# Patient Record
Sex: Female | Born: 1950 | Race: White | Hispanic: No | Marital: Married | Smoking: Current some day smoker
Health system: Southern US, Community
[De-identification: ages and names within clinical notes are randomized; demographics above are authoritative.]

## PROBLEM LIST (undated history)

## (undated) DIAGNOSIS — M1990 Unspecified osteoarthritis, unspecified site: Secondary | ICD-10-CM

## (undated) DIAGNOSIS — M940 Chondrocostal junction syndrome [Tietze]: Secondary | ICD-10-CM

## (undated) DIAGNOSIS — Z973 Presence of spectacles and contact lenses: Secondary | ICD-10-CM

## (undated) DIAGNOSIS — E785 Hyperlipidemia, unspecified: Secondary | ICD-10-CM

## (undated) DIAGNOSIS — I440 Atrioventricular block, first degree: Secondary | ICD-10-CM

## (undated) DIAGNOSIS — E0590 Thyrotoxicosis, unspecified without thyrotoxic crisis or storm: Secondary | ICD-10-CM

## (undated) DIAGNOSIS — K219 Gastro-esophageal reflux disease without esophagitis: Secondary | ICD-10-CM

## (undated) DIAGNOSIS — I10 Essential (primary) hypertension: Secondary | ICD-10-CM

## (undated) DIAGNOSIS — E0500 Thyrotoxicosis with diffuse goiter without thyrotoxic crisis or storm: Secondary | ICD-10-CM

## (undated) HISTORY — PX: TONSILLECTOMY AND ADENOIDECTOMY: SUR1326

---

## 2003-12-19 HISTORY — PX: SHOULDER ARTHROSCOPY WITH ROTATOR CUFF REPAIR: SHX5685

## 2010-11-25 ENCOUNTER — Ambulatory Visit
Admission: RE | Admit: 2010-11-25 | Discharge: 2010-11-25 | Payer: Self-pay | Source: Home / Self Care | Attending: Podiatry | Admitting: Podiatry

## 2010-11-25 HISTORY — PX: OTHER SURGICAL HISTORY: SHX169

## 2011-02-28 LAB — POCT I-STAT 4, (NA,K, GLUC, HGB,HCT)
Hemoglobin: 17 g/dL — ABNORMAL HIGH (ref 12.0–15.0)
Potassium: 4 mEq/L (ref 3.5–5.1)
Sodium: 142 mEq/L (ref 135–145)

## 2011-10-11 NOTE — Op Note (Signed)
NAME:  Lori Curtis, Lori Curtis            ACCOUNT NO.:  [id number]  MEDICAL RECORD NO.:  [id number]          PATIENT TYPE:  AMB  LOCATION:  [facility]                         FACILITY:  [facility]  PHYSICIAN:  Ezequiel Kayser. Saige Busby, D.P.M.DATE OF BIRTH:  Dec 23, 1950  DATE OF PROCEDURE: DATE OF DISCHARGE:  11/25/2010                              OPERATIVE REPORT   SURGEON:  Ezequiel Kayser. Harriet Pho, DPM  ASSISTANT:  None.  PREOPERATIVE DIAGNOSIS:  Hallux rigidus, left foot.  POSTOPERATIVE DIAGNOSIS:  Hallux rigidus, left foot.  PROCEDURE:  First MTP joint implant, left foot.  ANESTHESIA:  LMA.  COMPLICATIONS:  None.  DESCRIPTION OF PROCEDURE:  The patient was brought to the OR and placed in the supine position at which time, an LMA anesthetic was administered.  A local block was performed with a one-to-one mixture of 0.5% Marcaine plain and 0.5% Marcaine plain, and 1% lidocaine plain.  A well-padded pneumatic ankle tourniquet was applied superior to the medial malleolus.  The patient was prepped and draped in the usual aseptic manner.  The foot exsanguinated with an Esmarch bandage and the previously applied tourniquet inflated to 250 mmHg.  Attention was directed to the first ray where a dorsolinear incision was made.  The incision was deepened via sharp modalities, taking care to clamp and cauterize all bleeding vessels, and ensuring retraction of all neurovascular structures encountered.  The deep and superficial fascia was separated medially and laterally at the length of the incision.  The incision was continued such that a linear capsulotomy was performed. The capsule and periosteum were freed off the head of the first metatarsal and the base of the proximal phalanx.  There was a moderate amount of hypertrophic bone lipping, which was all resected and the medial eminence was resected, the first metatarsal head parallel with the shaft.  The first MTP joint was evaluated and approximately 80%  was eroded with one large essential erosion.  Attention was directed to the proximal aspect of the proximal phalanx where the bone was resected enough to allow for the implant to be placed in the proximal phalanx and to the decompression of the joint.  This was less than one third of the total shaft of the proximal phalanx.  This cut was made perpendicular to the weightbearing surface of the foot. This area was irrigated with copious amounts of sterile saline and antibiotic solution.  The central area of the proximal phalanx was drilled and a sizer placed to allow for appropriate implant incising. First MTP joint was evaluated with a close range of motion, and all hypertrophic bone again was resected and remodeled.  It was determined that a small medium-size cobalt-chromium nonporous implant was appropriate.  Using broach into the proximal phalanx, this allowed for placement of the implant.  Surgical wound had been irrigated with copious amounts of sterile saline and antibiotic solution before the implant was placed.  Range of motion was evaluated and found to be excellent.  Placement of the implant was confirmed with intraoperative radiographs.  Deep closure of the capsule and periosteum was reapproximated with 3-0 Vicryl, and deep closure was accomplished with 4- 0 Vicryl and skin closure  was accomplished with 4-0 Monocryl in running subcuticular fashion.  Postoperatively, additional 0.5% Marcaine plain was infiltrated around the surgical site.  The foot was dressed with Steri-Strips, 4 x 4's, Kling and Coban.  Tourniquet was deflated and vascular status returned to all digits.  The patient was sent to the recovery room with vital signs stable and capillary refill time at presurgical levels.  Written all postoperative instructions for the patient.  No guarantees given.          ______________________________ Ezequiel Kayser. Harriet Pho, D.P.M.     MJA/MEDQ  D:  12/13/2010  T:   12/13/2010  Job:  [id number]  Electronically Signed by Larey Dresser D.P.M. on 10/11/2011 05:56:10 PM

## 2012-07-02 ENCOUNTER — Encounter (HOSPITAL_COMMUNITY): Payer: Self-pay | Admitting: Pharmacy Technician

## 2012-07-11 ENCOUNTER — Ambulatory Visit (HOSPITAL_COMMUNITY)
Admission: RE | Admit: 2012-07-11 | Discharge: 2012-07-11 | Disposition: A | Discharge status: Home / Self Care | Payer: BC Managed Care – PPO | Source: Ambulatory Visit | Attending: Orthopaedic Surgery | Admitting: Orthopaedic Surgery

## 2012-07-11 ENCOUNTER — Other Ambulatory Visit: Payer: Self-pay | Admitting: Orthopaedic Surgery

## 2012-07-11 ENCOUNTER — Encounter (HOSPITAL_COMMUNITY)
Admission: RE | Admit: 2012-07-11 | Discharge: 2012-07-11 | Disposition: A | Discharge status: Home / Self Care | Payer: BC Managed Care – PPO | Source: Ambulatory Visit | Attending: Orthopaedic Surgery | Admitting: Orthopaedic Surgery

## 2012-07-11 ENCOUNTER — Encounter (HOSPITAL_COMMUNITY): Payer: Self-pay

## 2012-07-11 DIAGNOSIS — F17200 Nicotine dependence, unspecified, uncomplicated: Secondary | ICD-10-CM | POA: Insufficient documentation

## 2012-07-11 DIAGNOSIS — M5380 Other specified dorsopathies, site unspecified: Secondary | ICD-10-CM | POA: Insufficient documentation

## 2012-07-11 DIAGNOSIS — I10 Essential (primary) hypertension: Secondary | ICD-10-CM | POA: Insufficient documentation

## 2012-07-11 DIAGNOSIS — Z01812 Encounter for preprocedural laboratory examination: Secondary | ICD-10-CM | POA: Insufficient documentation

## 2012-07-11 DIAGNOSIS — Z01818 Encounter for other preprocedural examination: Secondary | ICD-10-CM | POA: Insufficient documentation

## 2012-07-11 DIAGNOSIS — I517 Cardiomegaly: Secondary | ICD-10-CM | POA: Insufficient documentation

## 2012-07-11 HISTORY — DX: Thyrotoxicosis, unspecified without thyrotoxic crisis or storm: E05.90

## 2012-07-11 HISTORY — DX: Essential (primary) hypertension: I10

## 2012-07-11 HISTORY — DX: Gastro-esophageal reflux disease without esophagitis: K21.9

## 2012-07-11 LAB — APTT: aPTT: 36 seconds (ref 24–37)

## 2012-07-11 LAB — PROTIME-INR: INR: 0.98 (ref 0.00–1.49)

## 2012-07-11 LAB — TYPE AND SCREEN
ABO/RH(D): O POS
Antibody Screen: NEGATIVE

## 2012-07-11 MED ORDER — VANCOMYCIN HCL 1000 MG IV SOLR: 2000.0000 mg | INTRAVENOUS | Status: DC

## 2012-07-11 MED ORDER — CHLORHEXIDINE GLUCONATE 4 % EX LIQD: 60.0000 mL | Freq: Once | CUTANEOUS | Status: DC

## 2012-07-11 NOTE — Pre-Procedure Instructions (Signed)
20 Lori Curtis  07/11/2012   Your procedure is scheduled on:  07/16/2012  TUESDAY  Report to [facility] at 0530 AM.  Call this number if you have problems the morning of surgery: [phone]   Remember:   Do not eat food:After Midnight.  May have  liquids:until Midnight .   Take these medicines the morning of surgery with A SIP OF WATER: AMLODIPINE  METOPROLOL   Do not wear jewelry, make-up or nail polish.  Do not wear lotions, powders, or perfumes. You may wear deodorant.  Do not shave 48 hours prior to surgery. Men may shave face and neck.  Do not bring valuables to the hospital.  Contacts, dentures or bridgework may not be worn into surgery.  Leave suitcase in the car. After surgery it may be brought to your room.  For patients admitted to the hospital, checkout time is 11:00 AM the day of discharge.   Patients discharged the day of surgery will not be allowed to drive home.  Name and phone number of your driver:   Special Instructions: CHG Shower Use Special Wash: 1/2 bottle night before surgery and 1/2 bottle morning of surgery.   Please read over the following fact sheets that you were given: Pain Booklet, Coughing and Deep Breathing, Blood Transfusion Information, Lab Information, Total Joint Packet, MRSA Information and Surgical Site Infection Prevention

## 2012-07-11 NOTE — Progress Notes (Addendum)
THERE ARE NO ORDERS FOR THIS PATIENT .LEFT MESSAGE ON Lori Curtis'S  ANSWERING  MACHINE REQUESTING ORDERS FOR 1000AM.   PRE ADMIT VISIT.

## 2012-07-12 NOTE — Consult Note (Addendum)
Anesthesia Chart Review:  Patient is a 61 year old female scheduled for left TKA on 07/16/12 by Dr. Jerl Santos.  History includes BMI 30, HTN, GERD, hyperthyroidism (Grave's disease), SOB, tonsillectomy.  Smoker per radiology report.  PCP is Dr. Daneil Dolin at [facility].  CXR on 07/11/12 showed: Diffuse interstitial prominence suggesting underlying bronchitic change in this patient with a history of smoking. Mild cardiac enlargement. No acute abnormality suggested.   Labs done at [facility] on 07/03/12 included a CMET, CBC, UA.  Cr 0.7,   PLT 94 (stable by notations on lab report).  H/H 15.3/44.8.   PT/PTT on 07/11/12 WNL.  PLT result called to Agustin Cree at Dr. Nolon Nations office.  EKG on 07/11/12 showed NSR, first degree AVB.  T wave inversion in aVL, nonspecific T wave abnormality in I.  Non-specific inferior ST abnormality.  (EKG read as SR with first degree AVB by interpreting Cardiologist.)  I reviewed her EKG findings with Anesthesiologist Dr. Jean Rosenthal.  In the absence of chest pain history and no known history of CAD then would anticipate she could proceed if no significant change in her status.  Records requested from Dr. Alben Spittle.  WIll follow-up when available.  Shonna Chock, PA-C 07/12/12 1215  Addendum: 07/12/12 1730 Notes reviewed from Dr. Wende Mott office.  There is no mention of thrombocytopenia history other then the notation on her labs sheet that read "stable over 4 years."  I reviewed her labs with Anesthesiologist Dr. Michelle Piper.  Will defer any additional orders, if any, to Dr. Jerl Santos.

## 2012-07-14 NOTE — H&P (Signed)
Lori Curtis is an 61 y.o. female.   Chief Complaint: Left knee pain HPI: She has had increasing left knee pain over the last year. Now pain with walking and at night. She has failed NSAID, cortisone and Supartz. 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 We have discussed moving on with a Left TKR to help restore painless function.  Past Medical History  Diagnosis Date  . Hypertension   . Hyperthyroidism     graves disease  . Shortness of breath   . GERD (gastroesophageal reflux disease)   . Arthritis     Past Surgical History  Procedure Date  . Tonsillectomy     No family history on file. Social History:  does not have a smoking history on file. She does not have any smokeless tobacco history on file. She reports that she does not drink alcohol or use illicit drugs.  Allergies:  Allergies  Allergen Reactions  . Amoxicillin     hives    No prescriptions prior to admission    No results found for this or any previous visit (from the past 48 hour(s)). No results found.  Review of Systems  Constitutional: Negative.   HENT: Negative.   Eyes: Negative.   Respiratory: Negative.   Cardiovascular: Negative.   Gastrointestinal: Negative.   Genitourinary: Negative.   Musculoskeletal: Negative.   Skin: Negative.   Neurological: Negative.   Endo/Heme/Allergies: Negative.   Psychiatric/Behavioral: Negative.     There were no vitals taken for this visit. Physical Exam  Constitutional: She appears well-nourished.  HENT:  Head: Atraumatic.  Eyes: Pupils are equal, round, and reactive to light.  Neck: Normal range of motion.  Cardiovascular: Regular rhythm.   Respiratory: Effort normal.  GI: Soft.  Musculoskeletal:       Left knee : pain with rom . Medial joint line pain. Rom 0 -120    !+ crepitation  Trace effusion  Neurological: She is alert.  Skin: Skin is warm.     Assessment/Plan  A: Left knee end stage DJD P: We have discussed going ahead with a L  TKR to relieve pain and improve function. We have discussed the risks associated with this procedure and the need for extensive rehab post op . She will be in the hospital between 2-3 days  Seth Higginbotham R 07/14/2012, 10:44 AM

## 2012-07-16 ENCOUNTER — Ambulatory Visit (HOSPITAL_COMMUNITY): Payer: BC Managed Care – PPO | Admitting: Vascular Surgery

## 2012-07-16 ENCOUNTER — Inpatient Hospital Stay (HOSPITAL_COMMUNITY)
Admission: RE | Admit: 2012-07-16 | Discharge: 2012-07-18 | DRG: 209 | Disposition: A | Discharge status: Home / Self Care | Payer: BC Managed Care – PPO | Source: Ambulatory Visit | Attending: Orthopaedic Surgery | Admitting: Orthopaedic Surgery

## 2012-07-16 ENCOUNTER — Encounter (HOSPITAL_COMMUNITY): Payer: Self-pay | Admitting: *Deleted

## 2012-07-16 ENCOUNTER — Encounter (HOSPITAL_COMMUNITY): Payer: Self-pay | Admitting: Vascular Surgery

## 2012-07-16 ENCOUNTER — Encounter (HOSPITAL_COMMUNITY)
Admission: RE | Disposition: A | Discharge status: Home / Self Care | Payer: Self-pay | Source: Ambulatory Visit | Attending: Orthopaedic Surgery

## 2012-07-16 DIAGNOSIS — M1712 Unilateral primary osteoarthritis, left knee: Secondary | ICD-10-CM | POA: Diagnosis present

## 2012-07-16 DIAGNOSIS — Z7982 Long term (current) use of aspirin: Secondary | ICD-10-CM

## 2012-07-16 DIAGNOSIS — Z79899 Other long term (current) drug therapy: Secondary | ICD-10-CM

## 2012-07-16 DIAGNOSIS — F17200 Nicotine dependence, unspecified, uncomplicated: Secondary | ICD-10-CM | POA: Diagnosis present

## 2012-07-16 DIAGNOSIS — I10 Essential (primary) hypertension: Secondary | ICD-10-CM | POA: Diagnosis present

## 2012-07-16 DIAGNOSIS — IMO0002 Reserved for concepts with insufficient information to code with codable children: Principal | ICD-10-CM | POA: Diagnosis present

## 2012-07-16 DIAGNOSIS — K219 Gastro-esophageal reflux disease without esophagitis: Secondary | ICD-10-CM | POA: Diagnosis present

## 2012-07-16 DIAGNOSIS — E0590 Thyrotoxicosis, unspecified without thyrotoxic crisis or storm: Secondary | ICD-10-CM | POA: Diagnosis present

## 2012-07-16 DIAGNOSIS — M1710 Unilateral primary osteoarthritis, unspecified knee: Principal | ICD-10-CM | POA: Diagnosis present

## 2012-07-16 HISTORY — PX: TOTAL KNEE ARTHROPLASTY: SHX125

## 2012-07-16 LAB — PROTIME-INR: Prothrombin Time: 13.1 seconds (ref 11.6–15.2)

## 2012-07-16 SURGERY — ARTHROPLASTY, KNEE, TOTAL
Anesthesia: General | Site: Knee | Laterality: Left | Wound class: Clean

## 2012-07-16 MED ORDER — DOCUSATE SODIUM 100 MG PO CAPS
100.0000 mg | ORAL_CAPSULE | Freq: Two times a day (BID) | ORAL | Status: DC
  Administered 2012-07-16 – 2012-07-18 (×5): 100 mg via ORAL
  Filled 2012-07-16 (×5): qty 1

## 2012-07-16 MED ORDER — ONDANSETRON HCL 4 MG/2ML IJ SOLN: 4.0000 mg | Freq: Four times a day (QID) | INTRAMUSCULAR | Status: DC | PRN

## 2012-07-16 MED ORDER — METHOCARBAMOL 500 MG PO TABS: 500.0000 mg | ORAL_TABLET | Freq: Four times a day (QID) | ORAL | Status: DC | PRN

## 2012-07-16 MED ORDER — HYDROMORPHONE HCL PF 1 MG/ML IJ SOLN
0.5000 mg | INTRAMUSCULAR | Status: DC | PRN
  Administered 2012-07-16 – 2012-07-17 (×7): 1 mg via INTRAVENOUS
  Filled 2012-07-16 (×7): qty 1

## 2012-07-16 MED ORDER — TOBRAMYCIN SULFATE 1.2 G IJ SOLR
INTRAMUSCULAR | Status: DC | PRN
  Administered 2012-07-16: 1.2 g

## 2012-07-16 MED ORDER — ACETAMINOPHEN 325 MG PO TABS: 650.0000 mg | ORAL_TABLET | Freq: Four times a day (QID) | ORAL | Status: DC | PRN

## 2012-07-16 MED ORDER — SIMVASTATIN 40 MG PO TABS
40.0000 mg | ORAL_TABLET | Freq: Every evening | ORAL | Status: DC
  Administered 2012-07-16 – 2012-07-17 (×2): 40 mg via ORAL
  Filled 2012-07-16 (×3): qty 1

## 2012-07-16 MED ORDER — METOPROLOL SUCCINATE ER 100 MG PO TB24
100.0000 mg | ORAL_TABLET | Freq: Every day | ORAL | Status: DC
  Administered 2012-07-17 – 2012-07-18 (×2): 100 mg via ORAL
  Filled 2012-07-16 (×2): qty 1

## 2012-07-16 MED ORDER — METOCLOPRAMIDE HCL 5 MG/ML IJ SOLN
5.0000 mg | Freq: Three times a day (TID) | INTRAMUSCULAR | Status: DC | PRN
  Filled 2012-07-16: qty 2

## 2012-07-16 MED ORDER — BUPIVACAINE-EPINEPHRINE PF 0.5-1:200000 % IJ SOLN
INTRAMUSCULAR | Status: DC | PRN
  Administered 2012-07-16: 30 mL

## 2012-07-16 MED ORDER — METOCLOPRAMIDE HCL 10 MG PO TABS: 5.0000 mg | ORAL_TABLET | Freq: Three times a day (TID) | ORAL | Status: DC | PRN

## 2012-07-16 MED ORDER — ASPIRIN EC 325 MG PO TBEC
325.0000 mg | DELAYED_RELEASE_TABLET | Freq: Two times a day (BID) | ORAL | Status: DC
  Administered 2012-07-16 – 2012-07-18 (×4): 325 mg via ORAL
  Filled 2012-07-16 (×6): qty 1

## 2012-07-16 MED ORDER — METHOCARBAMOL 100 MG/ML IJ SOLN
500.0000 mg | Freq: Four times a day (QID) | INTRAVENOUS | Status: DC | PRN
  Administered 2012-07-16: 500 mg via INTRAVENOUS
  Filled 2012-07-16: qty 5

## 2012-07-16 MED ORDER — DEXTROSE 5 % IV SOLN
INTRAVENOUS | Status: DC | PRN
  Administered 2012-07-16: 08:00:00 via INTRAVENOUS

## 2012-07-16 MED ORDER — CELECOXIB 200 MG PO CAPS
200.0000 mg | ORAL_CAPSULE | Freq: Every day | ORAL | Status: DC
  Administered 2012-07-17 – 2012-07-18 (×2): 200 mg via ORAL
  Filled 2012-07-16 (×2): qty 1

## 2012-07-16 MED ORDER — ACETAMINOPHEN 650 MG RE SUPP: 650.0000 mg | Freq: Four times a day (QID) | RECTAL | Status: DC | PRN

## 2012-07-16 MED ORDER — LACTATED RINGERS IV SOLN: INTRAVENOUS | Status: DC

## 2012-07-16 MED ORDER — METHIMAZOLE 5 MG PO TABS
5.0000 mg | ORAL_TABLET | Freq: Every day | ORAL | Status: DC
  Administered 2012-07-16 – 2012-07-17 (×2): 5 mg via ORAL
  Filled 2012-07-16 (×3): qty 1

## 2012-07-16 MED ORDER — METHIMAZOLE 5 MG PO TABS
2.5000 mg | ORAL_TABLET | Freq: Every day | ORAL | Status: DC
  Administered 2012-07-16 – 2012-07-17 (×2): 2.5 mg via ORAL
  Filled 2012-07-16 (×3): qty 1

## 2012-07-16 MED ORDER — EPHEDRINE SULFATE 50 MG/ML IJ SOLN
INTRAMUSCULAR | Status: DC | PRN
  Administered 2012-07-16 (×5): 5 mg via INTRAVENOUS
  Administered 2012-07-16: 10 mg via INTRAVENOUS

## 2012-07-16 MED ORDER — CHLORHEXIDINE GLUCONATE 4 % EX LIQD: 60.0000 mL | Freq: Once | CUTANEOUS | Status: DC

## 2012-07-16 MED ORDER — ALUM & MAG HYDROXIDE-SIMETH 200-200-20 MG/5ML PO SUSP: 30.0000 mL | ORAL | Status: DC | PRN

## 2012-07-16 MED ORDER — FLEET ENEMA 7-19 GM/118ML RE ENEM: 1.0000 | ENEMA | Freq: Once | RECTAL | Status: AC | PRN

## 2012-07-16 MED ORDER — OXYCODONE-ACETAMINOPHEN 5-325 MG PO TABS
1.0000 | ORAL_TABLET | ORAL | Status: DC | PRN
  Administered 2012-07-16 – 2012-07-18 (×9): 2 via ORAL
  Filled 2012-07-16 (×10): qty 2

## 2012-07-16 MED ORDER — VANCOMYCIN HCL IN DEXTROSE 1-5 GM/200ML-% IV SOLN
1000.0000 mg | INTRAVENOUS | Status: AC
  Administered 2012-07-16: 1000 mg via INTRAVENOUS

## 2012-07-16 MED ORDER — MENTHOL 3 MG MT LOZG: 1.0000 | LOZENGE | OROMUCOSAL | Status: DC | PRN

## 2012-07-16 MED ORDER — LACTATED RINGERS IV SOLN
INTRAVENOUS | Status: DC | PRN
  Administered 2012-07-16 (×2): via INTRAVENOUS

## 2012-07-16 MED ORDER — METOCLOPRAMIDE HCL 5 MG/ML IJ SOLN: 5.0000 mg | Freq: Three times a day (TID) | INTRAMUSCULAR | Status: DC | PRN

## 2012-07-16 MED ORDER — HYDROMORPHONE HCL PF 1 MG/ML IJ SOLN
0.2500 mg | INTRAMUSCULAR | Status: DC | PRN
  Administered 2012-07-16 (×2): 0.5 mg via INTRAVENOUS

## 2012-07-16 MED ORDER — METOCLOPRAMIDE HCL 5 MG PO TABS
5.0000 mg | ORAL_TABLET | Freq: Three times a day (TID) | ORAL | Status: DC | PRN
  Filled 2012-07-16: qty 2

## 2012-07-16 MED ORDER — ONDANSETRON HCL 4 MG PO TABS: 4.0000 mg | ORAL_TABLET | Freq: Four times a day (QID) | ORAL | Status: DC | PRN

## 2012-07-16 MED ORDER — TOBRAMYCIN SULFATE 1.2 G IJ SOLR
INTRAMUSCULAR | Status: AC
  Filled 2012-07-16: qty 1.2

## 2012-07-16 MED ORDER — DIPHENHYDRAMINE HCL 12.5 MG/5ML PO ELIX
12.5000 mg | ORAL_SOLUTION | ORAL | Status: DC | PRN
  Administered 2012-07-17 – 2012-07-18 (×4): 25 mg via ORAL
  Filled 2012-07-16 (×2): qty 5
  Filled 2012-07-16: qty 10

## 2012-07-16 MED ORDER — MIDAZOLAM HCL 5 MG/5ML IJ SOLN
INTRAMUSCULAR | Status: DC | PRN
  Administered 2012-07-16: 2 mg via INTRAVENOUS

## 2012-07-16 MED ORDER — ACETAMINOPHEN 10 MG/ML IV SOLN
INTRAVENOUS | Status: AC
  Filled 2012-07-16: qty 100

## 2012-07-16 MED ORDER — HYDROMORPHONE HCL PF 1 MG/ML IJ SOLN
INTRAMUSCULAR | Status: AC
  Filled 2012-07-16: qty 1

## 2012-07-16 MED ORDER — METHOCARBAMOL 500 MG PO TABS
500.0000 mg | ORAL_TABLET | Freq: Four times a day (QID) | ORAL | Status: DC | PRN
  Administered 2012-07-16 – 2012-07-17 (×3): 500 mg via ORAL
  Filled 2012-07-16 (×4): qty 1

## 2012-07-16 MED ORDER — METHOCARBAMOL 100 MG/ML IJ SOLN: 500.0000 mg | Freq: Four times a day (QID) | INTRAVENOUS | Status: DC | PRN

## 2012-07-16 MED ORDER — ONDANSETRON HCL 4 MG/2ML IJ SOLN
INTRAMUSCULAR | Status: DC | PRN
  Administered 2012-07-16: 4 mg via INTRAVENOUS

## 2012-07-16 MED ORDER — ZOLPIDEM TARTRATE 5 MG PO TABS
5.0000 mg | ORAL_TABLET | Freq: Every evening | ORAL | Status: DC | PRN
  Filled 2012-07-16 (×2): qty 1

## 2012-07-16 MED ORDER — LIDOCAINE HCL (CARDIAC) 20 MG/ML IV SOLN
INTRAVENOUS | Status: DC | PRN
  Administered 2012-07-16: 90 mg via INTRAVENOUS

## 2012-07-16 MED ORDER — METHIMAZOLE 5 MG PO TABS: 2.5000 mg | ORAL_TABLET | Freq: Two times a day (BID) | ORAL | Status: DC

## 2012-07-16 MED ORDER — LACTATED RINGERS IV SOLN
INTRAVENOUS | Status: DC
  Administered 2012-07-16: 10:00:00 via INTRAVENOUS

## 2012-07-16 MED ORDER — ONDANSETRON HCL 4 MG/2ML IJ SOLN: 4.0000 mg | Freq: Once | INTRAMUSCULAR | Status: DC | PRN

## 2012-07-16 MED ORDER — BISACODYL 5 MG PO TBEC: 5.0000 mg | DELAYED_RELEASE_TABLET | Freq: Every day | ORAL | Status: DC | PRN

## 2012-07-16 MED ORDER — AMLODIPINE BESYLATE 10 MG PO TABS
10.0000 mg | ORAL_TABLET | Freq: Every day | ORAL | Status: DC
  Administered 2012-07-17 – 2012-07-18 (×2): 10 mg via ORAL
  Filled 2012-07-16 (×2): qty 1

## 2012-07-16 MED ORDER — PHENOL 1.4 % MT LIQD: 1.0000 | OROMUCOSAL | Status: DC | PRN

## 2012-07-16 MED ORDER — VANCOMYCIN HCL IN DEXTROSE 1-5 GM/200ML-% IV SOLN
1000.0000 mg | Freq: Two times a day (BID) | INTRAVENOUS | Status: AC
  Administered 2012-07-16: 1000 mg via INTRAVENOUS
  Filled 2012-07-16: qty 200

## 2012-07-16 MED ORDER — PROPOFOL 10 MG/ML IV EMUL
INTRAVENOUS | Status: DC | PRN
  Administered 2012-07-16: 150 mg via INTRAVENOUS
  Administered 2012-07-16: 10 mg via INTRAVENOUS
  Administered 2012-07-16: 20 mg via INTRAVENOUS

## 2012-07-16 MED ORDER — FENTANYL CITRATE 0.05 MG/ML IJ SOLN
INTRAMUSCULAR | Status: DC | PRN
  Administered 2012-07-16 (×2): 50 ug via INTRAVENOUS
  Administered 2012-07-16: 100 ug via INTRAVENOUS
  Administered 2012-07-16: 50 ug via INTRAVENOUS

## 2012-07-16 MED ORDER — SODIUM CHLORIDE 0.9 % IR SOLN
Status: DC | PRN
  Administered 2012-07-16: 1000 mL

## 2012-07-16 SURGICAL SUPPLY — 64 items
AUTOTRANSFUSION W/QD PVC DRAIN (AUTOTRANSFUSION) ×2 IMPLANT
BANDAGE ELASTIC 4 VELCRO ST LF (GAUZE/BANDAGES/DRESSINGS) ×2 IMPLANT
BANDAGE ELASTIC 6 VELCRO ST LF (GAUZE/BANDAGES/DRESSINGS) ×2 IMPLANT
BANDAGE ESMARK 6X9 LF (GAUZE/BANDAGES/DRESSINGS) ×1 IMPLANT
BANDAGE GAUZE ELAST BULKY 4 IN (GAUZE/BANDAGES/DRESSINGS) ×2 IMPLANT
BENZOIN TINCTURE PRP APPL 2/3 (GAUZE/BANDAGES/DRESSINGS) ×2 IMPLANT
BLADE SAGITTAL 25.0X1.19X90 (BLADE) ×2 IMPLANT
BLADE SURG ROTATE 9660 (MISCELLANEOUS) IMPLANT
BNDG ELASTIC 6X10 VLCR STRL LF (GAUZE/BANDAGES/DRESSINGS) ×2 IMPLANT
BNDG ESMARK 6X9 LF (GAUZE/BANDAGES/DRESSINGS) ×2
BOWL SMART MIX CTS (DISPOSABLE) ×2 IMPLANT
CEMENT HV SMART SET (Cement) ×4 IMPLANT
CLOTH BEACON ORANGE TIMEOUT ST (SAFETY) ×2 IMPLANT
CLSR STERI-STRIP ANTIMIC 1/2X4 (GAUZE/BANDAGES/DRESSINGS) ×2 IMPLANT
COVER SURGICAL LIGHT HANDLE (MISCELLANEOUS) ×2 IMPLANT
CUFF TOURNIQUET SINGLE 34IN LL (TOURNIQUET CUFF) ×2 IMPLANT
CUFF TOURNIQUET SINGLE 44IN (TOURNIQUET CUFF) IMPLANT
DRAPE EXTREMITY T 121X128X90 (DRAPE) ×2 IMPLANT
DRAPE PROXIMA HALF (DRAPES) ×2 IMPLANT
DRAPE U-SHAPE 47X51 STRL (DRAPES) ×2 IMPLANT
DRSG ADAPTIC 3X8 NADH LF (GAUZE/BANDAGES/DRESSINGS) ×2 IMPLANT
DRSG PAD ABDOMINAL 8X10 ST (GAUZE/BANDAGES/DRESSINGS) ×2 IMPLANT
DURAPREP 26ML APPLICATOR (WOUND CARE) ×2 IMPLANT
ELECT REM PT RETURN 9FT ADLT (ELECTROSURGICAL) ×2
ELECTRODE REM PT RTRN 9FT ADLT (ELECTROSURGICAL) ×1 IMPLANT
EVACUATOR 1/8 PVC DRAIN (DRAIN) ×2 IMPLANT
FACESHIELD LNG OPTICON STERILE (SAFETY) ×4 IMPLANT
GLOVE BIO SURGEON STRL SZ8.5 (GLOVE) ×2 IMPLANT
GLOVE BIOGEL PI IND STRL 8 (GLOVE) ×1 IMPLANT
GLOVE BIOGEL PI IND STRL 8.5 (GLOVE) ×1 IMPLANT
GLOVE BIOGEL PI INDICATOR 8 (GLOVE) ×1
GLOVE BIOGEL PI INDICATOR 8.5 (GLOVE) ×1
GLOVE SS BIOGEL STRL SZ 8 (GLOVE) ×1 IMPLANT
GLOVE SUPERSENSE BIOGEL SZ 8 (GLOVE) ×1
GOWN PREVENTION PLUS XLARGE (GOWN DISPOSABLE) ×2 IMPLANT
GOWN PREVENTION PLUS XXLARGE (GOWN DISPOSABLE) ×2 IMPLANT
GOWN STRL NON-REIN LRG LVL3 (GOWN DISPOSABLE) ×2 IMPLANT
HANDPIECE INTERPULSE COAX TIP (DISPOSABLE) ×1
HOOD PEEL AWAY FACE SHEILD DIS (HOOD) ×2 IMPLANT
IMMOBILIZER KNEE 20 (SOFTGOODS)
IMMOBILIZER KNEE 20 THIGH 36 (SOFTGOODS) IMPLANT
IMMOBILIZER KNEE 22 UNIV (SOFTGOODS) ×2 IMPLANT
IMMOBILIZER KNEE 24 THIGH 36 (MISCELLANEOUS) IMPLANT
IMMOBILIZER KNEE 24 UNIV (MISCELLANEOUS)
KIT BASIN OR (CUSTOM PROCEDURE TRAY) ×2 IMPLANT
KIT ROOM TURNOVER OR (KITS) ×2 IMPLANT
MANIFOLD NEPTUNE II (INSTRUMENTS) ×2 IMPLANT
NS IRRIG 1000ML POUR BTL (IV SOLUTION) ×2 IMPLANT
PACK TOTAL JOINT (CUSTOM PROCEDURE TRAY) ×2 IMPLANT
PAD ARMBOARD 7.5X6 YLW CONV (MISCELLANEOUS) ×4 IMPLANT
SET HNDPC FAN SPRY TIP SCT (DISPOSABLE) ×1 IMPLANT
SPONGE GAUZE 4X4 12PLY (GAUZE/BANDAGES/DRESSINGS) ×2 IMPLANT
STAPLER VISISTAT 35W (STAPLE) ×2 IMPLANT
SUCTION FRAZIER TIP 10 FR DISP (SUCTIONS) ×2 IMPLANT
SUT MNCRL AB 4-0 PS2 18 (SUTURE) ×2 IMPLANT
SUT VIC AB 0 CT1 27 (SUTURE) ×2
SUT VIC AB 0 CT1 27XBRD ANBCTR (SUTURE) ×2 IMPLANT
SUT VIC AB 2-0 CT1 27 (SUTURE) ×2
SUT VIC AB 2-0 CT1 TAPERPNT 27 (SUTURE) ×2 IMPLANT
SUT VLOC 180 0 24IN GS25 (SUTURE) ×2 IMPLANT
TOWEL OR 17X24 6PK STRL BLUE (TOWEL DISPOSABLE) ×2 IMPLANT
TOWEL OR 17X26 10 PK STRL BLUE (TOWEL DISPOSABLE) ×2 IMPLANT
TRAY FOLEY CATH 14FR (SET/KITS/TRAYS/PACK) ×2 IMPLANT
WATER STERILE IRR 1000ML POUR (IV SOLUTION) ×6 IMPLANT

## 2012-07-16 NOTE — Preoperative (Signed)
Beta Blockers   Reason not to administer Beta Blockers:toprol 0445 today

## 2012-07-16 NOTE — Plan of Care (Signed)
Problem: Consults Goal: Diagnosis- Total Joint Replacement Primary Total Knee Left     

## 2012-07-16 NOTE — Op Note (Addendum)
PREOP DIAGNOSIS: DJD LEFT KNEE POSTOP DIAGNOSIS: DJD LEFT KNEE PROCEDURE: LEFT TKR ANESTHESIA: General and block ATTENDING SURGEON: Deaunte Dente G ASSISTANT: Lindwood Qua PA  INDICATIONS FOR PROCEDURE: Lori Curtis is a 61 y.o. female who has struggled for a long time with pain due to degenerative arthritis of the left knee.  The patient has failed many conservative non-operative measures and at this point has pain which limits the ability to sleep and walk.  The patient is offered total knee replacement.  Informed operative consent was obtained after discussion of possible risks of anesthesia, infection, neurovascular injury, DVT, and death.  The importance of the post-operative rehabilitation protocol to optimize result was stressed extensively with the patient.  SUMMARY OF FINDINGS AND PROCEDURE:  Lori Curtis was taken to the operative suite where under the above anesthesia a left knee replacement was performed.  There were advanced degenerative changes and the bone quality was excellent.  We used the DePuy system and placed size standard femur, 2.5 tibia, 35mm all polyethylene patella, and a size 10mm spacer.  I did include tobromycin antibiotic in the cement.  The patient was admitted for appropriate post-op care to include perioperative antibiotics and mechanical and pharmacologic measures for DVT prophylaxis.  DESCRIPTION OF PROCEDURE:  Lori Curtis was taken to the operative suite where the above anesthesia was applied.  The patient was positioned supine and prepped and draped in normal sterile fashion.  An appropriate time out was performed.  After the administration of vancomycin pre-op antibiotic a standard longitudinal incision was made on the anterior knee.  Dissection was carried down to the extensor mechanism.  All appropriate anti-infective measures were used including the pre-operative antibiotic, betadine impregnated drape, and closed hooded exhaust systems for each  member of the surgical team.  A medial parapatellar incision was made in the extensor mechanism and the knee cap flipped and the knee flexed.  Some residual meniscal tissues were removed along with any remaining ACL/PCL tissue.  A guide was placed on the tibia and a flat cut was made on it's superior surface.  An intramedullary guide was placed in the femur and was utilized to make anterior and posterior cuts creating an appropriate flexion gap.  A second intramedullary guide was placed in the femur to make a distal cut properly balancing the knee with an extension gap equal to the flexion gap.  The three bones sized to the above mentioned sizes and the appropriate guides were placed and utilized.  A trial reduction was done and the knee easily came to full extension and the patella tracked well on flexion.  The trial components were removed and all bones were cleaned with pulsatile lavage and then dried thoroughly.  Cement was mixed including antibiotic and was pressurized onto the bones followed by placement of the aforementioned components.  Excess cement was trimmed and pressure was held on the components until the cement had hardened.  The tourniquet was deflated and a small amount of bleeding was controlled with cautery and pressure.  The knee was irrigated.throoughly. The extensor mechanism was re-approximated with V-loc suture in running fashion.  The knee was flexed and the repair was solid.  The subcutaneous tissues were re-approximated with #0 and #2-0 vicryl and the skin closed with staples.  A sterile dressing was applied.  Intraoperative fluids, EBL, and tourniquet time can be obtained from anesthesia records.  DISPOSITION:  The patient was taken to recovery room in stable condition and admitted for appropriate post-op care to include peri-operative  antibiotic and DVT prophylaxis with mechanical and pharmacologic measures.  Athanasius Kesling G 07/16/2012, 9:53 AM

## 2012-07-16 NOTE — Anesthesia Procedure Notes (Signed)
Anesthesia Regional Block:  Femoral nerve block  Pre-Anesthetic Checklist: ,, timeout performed, Correct Patient, Correct Site, Correct Laterality, Correct Procedure, Correct Position, site marked, Risks and benefits discussed,  Surgical consent,  Pre-op evaluation,  At surgeon's request and post-op pain management  Laterality: Left and Lower  Prep: chloraprep       Needles:  Injection technique: Single-shot  Needle Type: Echogenic Needle     Needle Length: 9cm  Needle Gauge: 22 and 22 G    Additional Needles:  Procedures: ultrasound guided Femoral nerve block Narrative:  Start time: 07/16/2012 7:28 AM End time: 07/16/2012 7:49 AM Injection made incrementally with aspirations every 5 mL.  Performed by: Personally  Anesthesiologist: Sheldon Silvan, MD  Additional Notes: Marcaine 0.5% with EPI 1:200000, 30 ml.  [place] picture unavailable as printer malfunctioned on [place] machine.  Femoral nerve block

## 2012-07-16 NOTE — Interval H&P Note (Signed)
History and Physical Interval Note:  07/16/2012 7:25 AM  Lori Curtis  has presented today for surgery, with the diagnosis of LEFT KNEE DEGENERATIVE JOINT DISEASE  The various methods of treatment have been discussed with the patient and family. After consideration of risks, benefits and other options for treatment, the patient has consented to  Procedure(s) (LRB): TOTAL KNEE ARTHROPLASTY (Left) as a surgical intervention .  The patient's history has been reviewed, patient examined, no change in status, stable for surgery.  I have reviewed the patient's chart and labs.  Questions were answered to the patient's satisfaction.     Xander Jutras G

## 2012-07-16 NOTE — Anesthesia Postprocedure Evaluation (Signed)
  Anesthesia Post-op Note  Patient: Lori Curtis  Procedure(s) Performed: Procedure(s) (LRB): TOTAL KNEE ARTHROPLASTY (Left)  Patient Location: PACU  Anesthesia Type: General  Level of Consciousness: awake, alert  and oriented  Airway and Oxygen Therapy: Patient Spontanous Breathing and Patient connected to nasal cannula oxygen  Post-op Pain: mild  Post-op Assessment: Post-op Vital signs reviewed  Post-op Vital Signs: Reviewed  Complications: No apparent anesthesia complications

## 2012-07-16 NOTE — Progress Notes (Signed)
Orthopedic Tech Progress Note Patient Details:  Lori Curtis [birth year] [id number]  CPM Left Knee CPM Left Knee: On Left Knee Flexion (Degrees): 60  Left Knee Extension (Degrees): 0    Lori Curtis T 07/16/2012, 10:58 AM

## 2012-07-16 NOTE — Transfer of Care (Signed)
Immediate Anesthesia Transfer of Care Note  Patient: Lori Curtis  Procedure(s) Performed: Procedure(s) (LRB): TOTAL KNEE ARTHROPLASTY (Left)  Patient Location: PACU  Anesthesia Type: General  Level of Consciousness: awake and pateint uncooperative  Airway & Oxygen Therapy: Patient Spontanous Breathing and Patient connected to nasal cannula oxygen  Post-op Assessment: Report given to PACU RN, Post -op Vital signs reviewed and stable, Patient moving all extremities and writhing in pain/disorientation...  Post vital signs: Reviewed and stable  Complications: No apparent anesthesia complications

## 2012-07-16 NOTE — Anesthesia Preprocedure Evaluation (Addendum)
Anesthesia Evaluation  Patient identified by MRN, date of birth, ID band Patient awake    Reviewed: Allergy & Precautions, H&P , NPO status , Patient's Chart, lab work & pertinent test results, reviewed documented beta blocker date and time   Airway Mallampati: I TM Distance: >3 FB Neck ROM: Full    Dental  (+) Teeth Intact, Dental Advisory Given and Partial Upper   Pulmonary  breath sounds clear to auscultation        Cardiovascular hypertension, Pt. on medications and Pt. on home beta blockers Rhythm:Regular Rate:Normal     Neuro/Psych    GI/Hepatic GERD-  Medicated and Controlled,  Endo/Other  Hyperthyroidism   Renal/GU      Musculoskeletal   Abdominal   Peds  Hematology   Anesthesia Other Findings   Reproductive/Obstetrics                         Anesthesia Physical Anesthesia Plan  ASA: III  Anesthesia Plan: General   Post-op Pain Management:    Induction: Intravenous  Airway Management Planned: LMA  Additional Equipment:   Intra-op Plan:   Post-operative Plan: Extubation in OR  Informed Consent: I have reviewed the patients History and Physical, chart, labs and discussed the procedure including the risks, benefits and alternatives for the proposed anesthesia with the patient or authorized representative who has indicated his/her understanding and acceptance.   Dental advisory given  Plan Discussed with: CRNA, Anesthesiologist and Surgeon  Anesthesia Plan Comments:         Anesthesia Quick Evaluation

## 2012-07-17 LAB — BASIC METABOLIC PANEL
GFR calc Af Amer: >90 mL/min (ref >90)
GFR calc non Af Amer: >90 mL/min (ref >90)
Potassium: 4.6 mEq/L (ref 3.5–5.1)
Sodium: 134 mEq/L — ABNORMAL LOW (ref 135–145)

## 2012-07-17 LAB — CBC
MCHC: 33.2 g/dL (ref 30.0–36.0)
Platelets: 91 10*3/uL — ABNORMAL LOW (ref 150–400)
RDW: 14.3 % (ref 11.5–15.5)

## 2012-07-17 NOTE — Progress Notes (Signed)
BMET pending at this time

## 2012-07-17 NOTE — Progress Notes (Signed)
Physical Therapy Treatment Patient Details Name: Lori Curtis MRN: [id number] DOB: November 17, 1951 Today's Date: 07/17/2012 Time: 1333-1400 PT Time Calculation (min): 27 min  PT Assessment / Plan / Recommendation Comments on Treatment Session  Pt able to increase ambulation distance this PM and complete HEP.  Pt left on CPM on 0 to 60 degrees.  Will continue to follow.    Follow Up Recommendations  Home health PT    Barriers to Discharge        Equipment Recommendations  None recommended by PT    Recommendations for Other Services    Frequency 7X/week   Plan Discharge plan remains appropriate;Frequency remains appropriate    Precautions / Restrictions Precautions Precautions: Knee Precaution Booklet Issued: No Restrictions Weight Bearing Restrictions: Yes LLE Weight Bearing: Weight bearing as tolerated   Pertinent Vitals/Pain 6/10 left LE pain    Mobility  Bed Mobility Bed Mobility: Supine to Sit Supine to Sit: 4: Min assist;HOB elevated Sit to Supine: 4: Min assist Details for Bed Mobility Assistance: (A) with left LE OOB and into bed  Transfers Transfers: Sit to Stand;Stand to Sit Sit to Stand: 4: Min assist;With upper extremity assist;From bed Stand to Sit: 4: Min assist;With upper extremity assist;To chair/3-in-1 Details for Transfer Assistance: (A) to initaite transfer and cues for hand placement Ambulation/Gait Ambulation/Gait Assistance: 4: Min guard Ambulation Distance (Feet): 100 Feet Assistive device: Rolling walker Ambulation/Gait Assistance Details: Minguard for safety with cues for RW placement and proper step sequence. Gait Pattern: Step-to pattern;Decreased step length - left;Decreased stance time - left;Trunk flexed Stairs: No    Exercises Total Joint Exercises Ankle Circles/Pumps: AROM;Left;10 reps;Supine Quad Sets: AROM;Left;10 reps;Supine Short Arc Quad: Strengthening;Left;5 reps Heel Slides: AAROM;Left;10 reps;Supine Hip ABduction/ADduction:  Strengthening;Left;5 reps Straight Leg Raises: Strengthening;Left;5 reps   PT Diagnosis:    PT Problem List:   PT Treatment Interventions:     PT Goals Acute Rehab PT Goals PT Goal Formulation: With patient Time For Goal Achievement: 07/24/12 Potential to Achieve Goals: Good Pt will go Supine/Side to Sit: with modified independence PT Goal: Supine/Side to Sit - Progress: Progressing toward goal Pt will go Sit to Supine/Side: with modified independence PT Goal: Sit to Supine/Side - Progress: Progressing toward goal Pt will go Sit to Stand: with modified independence PT Goal: Sit to Stand - Progress: Progressing toward goal Pt will go Stand to Sit: with modified independence PT Goal: Stand to Sit - Progress: Progressing toward goal Pt will Ambulate: >150 feet;with modified independence;with least restrictive assistive device PT Goal: Ambulate - Progress: Progressing toward goal Pt will Perform Home Exercise Program: Independently PT Goal: Perform Home Exercise Program - Progress: Progressing toward goal  Visit Information  Last PT Received On: 07/17/12 Assistance Needed: +1    Subjective Data  Subjective: "I'm much better this afternoon." Patient Stated Goal: Go to girlfriend's house.   Cognition  Overall Cognitive Status: Appears within functional limits for tasks assessed/performed Arousal/Alertness: Awake/alert Orientation Level: Appears intact for tasks assessed Behavior During Session: [facility] for tasks performed    Balance     End of Session PT - End of Session Equipment Utilized During Treatment: Gait belt Activity Tolerance: Patient tolerated treatment well Patient left: in bed;with call bell/phone within reach;with family/visitor present Nurse Communication: Mobility status   GP     Lori Curtis 07/17/2012, 2:47 PM Lori Curtis, PT DPT [phone]

## 2012-07-17 NOTE — Progress Notes (Signed)
Subjective: 1 Day Post-Op Procedure(s) (LRB): TOTAL KNEE ARTHROPLASTY (Left)  Activity level:  Bed rest , oob today with pt  wbat Diet tolerance:  eating Voiding:  Foley out  Patient reports pain as 6 on 0-10 scale.    Objective: Vital signs in last 24 hours: Temp:  [97 F (36.1 C)-99.7 F (37.6 C)] 99.7 F (37.6 C) (07/31 0555) Pulse Rate:  [58-67] 67  (07/31 0555) Resp:  [12-20] 20  (07/31 0555) BP: (110-122)/(43-58) 122/53 mmHg (07/31 0555) SpO2:  [97 %-100 %] 98 % (07/31 0555)  Labs:  Basename 07/17/12 0706  HGB 13.0    Basename 07/17/12 0706  WBC 9.0  RBC 4.31  HCT 39.1  PLT PENDING   No results found for this basename: NA:2,K:2,CL:2,CO2:2,BUN:2,CREATININE:2,GLUCOSE:2,CALCIUM:2 in the last 72 hours  Basename 07/16/12 0643  LABPT --  INR 0.97    Physical Exam:  Neurologically intact ABD soft Neurovascular intact Sensation intact distally Intact pulses distally Dorsiflexion/Plantar flexion intact No cellulitis present Compartment soft Ace wrap removed , dressing is dry  Assessment/Plan:  1 Day Post-Op Procedure(s) (LRB): TOTAL KNEE ARTHROPLASTY (Left) Advance diet Up with therapy D/C IV fluids Plan for discharge tomorrow if all is well Will need home pt    Lori Curtis 07/17/2012, 7:50 AM

## 2012-07-17 NOTE — Progress Notes (Signed)
CARE MANAGEMENT NOTE 07/17/2012  Patient:  Lori Curtis, Lori Curtis   Account Number:  [id number]  Date Initiated:  07/16/2012  Documentation initiated by:  Anette Guarneri  Subjective/Objective Assessment:   Left TKA  plans to d/c home with friend     Action/Plan:   [facility] services w/[facility]  patient has rolling walker, 3in1 and CPM, family support at discharge.   Anticipated DC Date:  07/19/2012   Anticipated DC Plan:  HOME W HOME HEALTH SERVICES         Choice offered to / List presented to:          [facility] arranged  HH-2 PT      [facility] agency  [facility]   Status of service:  Completed, signed off Discharge Disposition:  HOME W HOME HEALTH SERVICES  Per UR Regulation:  Reviewed for med. necessity/level of care/duration of stay  Comments:  07/16/12  15:27 Anette Guarneri RN/CM MD office pre-arranged [facility] services with Genevieve Norlander.

## 2012-07-17 NOTE — Evaluation (Signed)
Physical Therapy Evaluation Patient Details Name: Lori Curtis MRN: [id number] DOB: Jul 08, 1951 Today's Date: 07/17/2012 Time: 1191-4782 PT Time Calculation (min): 26 min  PT Assessment / Plan / Recommendation Clinical Impression  Pt is a 61 y/o female admitted s/p left TKA along with the below PT problem list.  Pt would benefit from acute PT to maximize independence and facilitate d/c to friend's home with HHPT.    PT Assessment  Patient needs continued PT services    Follow Up Recommendations  Home health PT    Barriers to Discharge None      Equipment Recommendations  None recommended by PT    Recommendations for Other Services     Frequency 7X/week    Precautions / Restrictions Precautions Precautions: Knee Precaution Booklet Issued: No Restrictions Weight Bearing Restrictions: Yes LLE Weight Bearing: Weight bearing as tolerated   Pertinent Vitals/Pain 7/10 in left knee.  Pt repositioned and RN aware.      Mobility  Bed Mobility Bed Mobility: Supine to Sit Supine to Sit: 4: Min assist;HOB elevated (HOB 45 degrees.) Details for Bed Mobility Assistance: Assist for left LE due to pain.  Cues for sequence. Transfers Transfers: Sit to Stand;Stand to Sit Sit to Stand: 4: Min assist;With upper extremity assist;From bed Stand to Sit: 4: Min assist;With upper extremity assist;To chair/3-in-1 Details for Transfer Assistance: Assist for balance with cues for hand/left LE placement. Ambulation/Gait Ambulation/Gait Assistance: 4: Min assist Ambulation Distance (Feet): 15 Feet Assistive device: Rolling walker Ambulation/Gait Assistance Details: Assist for balance with cues for safe step-to sequence as well as tall posture.  Distance limited by severe nausea with ambulation.  RN aware. Gait Pattern: Step-to pattern;Decreased step length - left;Decreased stance time - left;Trunk flexed Stairs: No Wheelchair Mobility Wheelchair Mobility: No    Exercises Total Joint  Exercises Ankle Circles/Pumps: AROM;Left;10 reps;Supine Quad Sets: AROM;Left;10 reps;Supine Heel Slides: AAROM;Left;10 reps;Supine   PT Diagnosis: Difficulty walking;Acute pain  PT Problem List: Decreased strength;Decreased range of motion;Decreased activity tolerance;Decreased balance;Decreased mobility;Decreased knowledge of use of DME;Decreased knowledge of precautions;Pain PT Treatment Interventions: DME instruction;Gait training;Functional mobility training;Therapeutic activities;Therapeutic exercise;Balance training;Patient/family education;Stair training   PT Goals Acute Rehab PT Goals PT Goal Formulation: With patient Time For Goal Achievement: 07/24/12 Potential to Achieve Goals: Good Pt will go Supine/Side to Sit: with modified independence PT Goal: Supine/Side to Sit - Progress: Goal set today Pt will go Sit to Supine/Side: with modified independence PT Goal: Sit to Supine/Side - Progress: Goal set today Pt will go Sit to Stand: with modified independence PT Goal: Sit to Stand - Progress: Goal set today Pt will go Stand to Sit: with modified independence PT Goal: Stand to Sit - Progress: Goal set today Pt will Ambulate: >150 feet;with modified independence;with least restrictive assistive device PT Goal: Ambulate - Progress: Goal set today Pt will Go Up / Down Stairs: 3-5 stairs;with min assist;with least restrictive assistive device PT Goal: Up/Down Stairs - Progress: Goal set today Pt will Perform Home Exercise Program: Independently PT Goal: Perform Home Exercise Program - Progress: Goal set today  Visit Information  Last PT Received On: 07/17/12 Assistance Needed: +1    Subjective Data  Subjective: "I have NO pain tolerance." Patient Stated Goal: Go to girlfriend's house.   Prior Functioning  Home Living Lives With: Spouse Available Help at Discharge: Friend(s);Available 24 hours/day Type of Home: House Home Access: Stairs to enter [employer] of  Steps: 3 Entrance Stairs-Rails: None Home Layout: One level Bathroom Shower/Tub: Health visitor:  Standard Home Adaptive Equipment: Bedside commode/3-in-1;Walker - rolling Prior Function Level of Independence: Independent Able to Take Stairs?: Yes Driving: Yes Vocation: Full time employment Communication Communication: No difficulties    Cognition  Overall Cognitive Status: Appears within functional limits for tasks assessed/performed Arousal/Alertness: Awake/alert Orientation Level: Appears intact for tasks assessed Behavior During Session: [facility] for tasks performed    Extremity/Trunk Assessment Right Upper Extremity Assessment RUE ROM/Strength/Tone: Within functional levels RUE Sensation: WFL - Light Touch RUE Coordination: WFL - gross/fine motor Left Upper Extremity Assessment LUE ROM/Strength/Tone: Within functional levels LUE Sensation: WFL - Light Touch LUE Coordination: WFL - gross/fine motor Right Lower Extremity Assessment RLE ROM/Strength/Tone: Within functional levels RLE Sensation: WFL - Light Touch RLE Coordination: WFL - gross/fine motor Left Lower Extremity Assessment LLE ROM/Strength/Tone: Deficits;Due to pain LLE ROM/Strength/Tone Deficits: 2/5 throughout.  AA/ROM 0-30 degrees. LLE Sensation: WFL - Light Touch LLE Coordination: WFL - gross motor Trunk Assessment Trunk Assessment: Normal   Balance Balance Balance Assessed: No  End of Session PT - End of Session Equipment Utilized During Treatment: Gait belt Activity Tolerance: Patient tolerated treatment well Patient left: in chair;with call bell/phone within reach Nurse Communication: Mobility status CPM Left Knee CPM Left Knee: Off  GP     Cephus Shelling 07/17/2012, 9:53 AM  07/17/2012 Cephus Shelling, PT, DPT [phone]

## 2012-07-17 NOTE — Progress Notes (Signed)
UR COMPLETED  

## 2012-07-18 ENCOUNTER — Encounter (HOSPITAL_COMMUNITY): Payer: Self-pay | Admitting: Orthopaedic Surgery

## 2012-07-18 LAB — CBC
Hemoglobin: 11.3 g/dL — ABNORMAL LOW (ref 12.0–15.0)
MCHC: 32.8 g/dL (ref 30.0–36.0)
RBC: 3.79 MIL/uL — ABNORMAL LOW (ref 3.87–5.11)
WBC: 9.1 10*3/uL (ref 4.0–10.5)

## 2012-07-18 MED ORDER — CELECOXIB 200 MG PO CAPS: 200.0000 mg | ORAL_CAPSULE | Freq: Once | ORAL | Status: DC

## 2012-07-18 MED ORDER — OXYCODONE-ACETAMINOPHEN 5-325 MG PO TABS: 1.0000 | ORAL_TABLET | ORAL | Status: AC | PRN

## 2012-07-18 MED ORDER — METHOCARBAMOL 500 MG PO TABS: 500.0000 mg | ORAL_TABLET | Freq: Four times a day (QID) | ORAL | Status: AC | PRN

## 2012-07-18 MED ORDER — ASPIRIN 325 MG PO TBEC: 325.0000 mg | DELAYED_RELEASE_TABLET | Freq: Two times a day (BID) | ORAL | Status: AC

## 2012-07-18 NOTE — Progress Notes (Signed)
Subjective: 2 Days Post-Op Procedure(s) (LRB): TOTAL KNEE ARTHROPLASTY (Left)  Activity level:  Out of bed and walking in the hallway with physical therapy Diet tolerance:  Eating well Voiding:  No problem voiding Patient reports pain as 1 on 0-10 scale.    Objective: Vital signs in last 24 hours: Temp:  [98 F (36.7 C)-98.9 F (37.2 C)] 98 F (36.7 C) (08/01 0535) Pulse Rate:  [58-70] 58  (08/01 0535) Resp:  [18-20] 18  (08/01 0535) BP: (116-138)/(49-54) 126/49 mmHg (08/01 0535) SpO2:  [92 %-98 %] 92 % (08/01 0535)  Labs:  Basename 07/18/12 0559 07/17/12 0706  HGB 11.3* 13.0    Basename 07/18/12 0559 07/17/12 0706  WBC 9.1 9.0  RBC 3.79* 4.31  HCT 34.5* 39.1  PLT 85* 91*    Basename 07/17/12 0706  NA 134*  K 4.6  CL 98  CO2 27  BUN 10  CREATININE 0.68  GLUCOSE 119*  CALCIUM 8.8    Basename 07/16/12 0643  LABPT --  INR 0.97    Physical Exam:  Neurologically intact ABD soft Neurovascular intact Sensation intact distally Intact pulses distally Dorsiflexion/Plantar flexion intact No cellulitis present Compartment soft Dressing is changed wound is benign no sign of infection.  Assessment/Plan:  2 Days Post-Op Procedure(s) (LRB): TOTAL KNEE ARTHROPLASTY (Left) Advance diet Discharge home with home health Patient will be discharged him today with a prescription for ASA 325 one by mouth twice a day, Celebrex 200 mg 1 pill per day, Percocet as needed for pain and Robaxin for spasm as needed She will return to our office in 2 weeks for a recheck.    Ustin Cruickshank R 07/18/2012, 2:28 PM

## 2012-07-18 NOTE — Discharge Summary (Signed)
Patient ID: Lori Curtis MRN: [id number] DOB/AGE: [birth year] 61 y.o.  Admit date: 07/16/2012 Discharge date: 07/18/2012  Admission Diagnoses:  Principal Problem:  *Left knee DJD   Discharge Diagnoses:  Same  Past Medical History  Diagnosis Date  . Hypertension   . Hyperthyroidism     graves disease  . Shortness of breath   . GERD (gastroesophageal reflux disease)   . Arthritis     Surgeries: Procedure(s): TOTAL KNEE ARTHROPLASTY on 07/16/2012   Consultants:    Discharged Condition: Improved  Hospital Course: Lori Curtis is an 61 y.o. female who was admitted 07/16/2012 for operative treatment ofLeft knee DJD. Patient has severe unremitting pain that affects sleep, daily activities, and work/hobbies. After pre-op clearance the patient was taken to the operating room on 07/16/2012 and underwent  Procedure(s): TOTAL KNEE ARTHROPLASTY.    Patient was given perioperative antibiotics: Anti-infectives     Start     Dose/Rate Route Frequency Ordered Stop   07/16/12 2000   vancomycin (VANCOCIN) IVPB 1000 mg/200 mL premix        1,000 mg 200 mL/hr over 60 Minutes Intravenous Every 12 hours 07/16/12 1137 07/16/12 2224   07/16/12 0847   tobramycin (NEBCIN) powder  Status:  Discontinued          As needed 07/16/12 0847 07/16/12 0952   07/16/12 0635   vancomycin (VANCOCIN) IVPB 1000 mg/200 mL premix        1,000 mg 200 mL/hr over 60 Minutes Intravenous 60 min pre-op 07/16/12 4098 07/16/12 0755           Patient was given sequential compression devices, early ambulation, and chemoprophylaxis to prevent DVT. ASA 325 mg 1 by mouth twice a day was used as a DVT prophylaxis agent. This will be continued for 2 weeks postoperatively.  Patient benefited maximally from hospital stay and there were no complications.    Recent vital signs: Patient Vitals for the past 24 hrs:  BP Temp Pulse Resp SpO2  07/18/12 0535 126/49 mmHg 98 F (36.7 C) 58  18  92 %  2012/08/15 2050 138/54  mmHg 98.7 F (37.1 C) 70  18  96 %  2012-08-15 1600 116/52 mmHg 98.9 F (37.2 C) 62  20  98 %     Recent laboratory studies:  Basename 07/18/12 0559 Aug 15, 2012 0706 07/16/12 0643  WBC 9.1 9.0 --  HGB 11.3* 13.0 --  HCT 34.5* 39.1 --  PLT 85* 91* --  NA -- 134* --  K -- 4.6 --  CL -- 98 --  CO2 -- 27 --  BUN -- 10 --  CREATININE -- 0.68 --  GLUCOSE -- 119* --  INR -- -- 0.97  CALCIUM -- 8.8 --     Discharge Medications:   Medication List  As of 07/18/2012  2:39 PM   TAKE these medications         amLODipine 10 MG tablet   Commonly known as: NORVASC   Take 10 mg by mouth daily.      aspirin 325 MG EC tablet   Take 1 tablet (325 mg total) by mouth 2 (two) times daily.      celecoxib 200 MG capsule   Commonly known as: CELEBREX   Take 1 capsule (200 mg total) by mouth once.      methimazole 5 MG tablet   Commonly known as: TAPAZOLE   Take 2.5-5 mg by mouth 2 (two) times daily. 5 mg every morning and 2.5 mg at bedtime  methocarbamol 500 MG tablet   Commonly known as: ROBAXIN   Take 1 tablet (500 mg total) by mouth every 6 (six) hours as needed.      metoprolol succinate 100 MG 24 hr tablet   Commonly known as: TOPROL-XL   Take 100 mg by mouth daily. Take with or immediately following a meal.      oxyCODONE-acetaminophen 5-325 MG per tablet   Commonly known as: PERCOCET/ROXICET   Take 1-2 tablets by mouth every 4 (four) hours as needed.      simvastatin 40 MG tablet   Commonly known as: ZOCOR   Take 40 mg by mouth every evening.            Diagnostic Studies: Dg Chest 2 View  07/11/2012  *RADIOLOGY REPORT*  Clinical Data: Preoperative evaluation for total left knee arthroplasty.  Smoker.  Hypertension.  No current chest complaints  CHEST - 2 VIEW  Comparison: None.  Findings: Heart size is mildly enlarged and a normal mediastinal configuration is seen.  The lung fields are notable for diffuse prominence of the interstitial markings and some central  peribronchial cuffing.  Findings are most compatible with underlying bronchitic change given the patient's history of smoking.  No definite focal infiltrates or signs of congestive failure are seen.  No pleural fluid is noted.  Bony structures demonstrate degenerative osteophytosis of the mid thoracic spine.  IMPRESSION: Diffuse interstitial prominence suggesting underlying bronchitic change in this patient with a history of smoking.  Mild cardiac enlargement.  No acute abnormality suggested.  Original Report Authenticated By: Bertha Stakes, M.D.    Disposition: Patient was discharged home in good condition    Follow-up Information    Follow up with Velna Ochs, MD in 12 days.   Contact information:   [address] [place] [address] [phone]         any sign of increasing infection or other issues please contact our office at [phone]. To prevent constipation eat. a balanced diet and drink plenty of fluids. Continue taking aspirin 325 mg one twice a day with food for 2 weeks to prevent DVT.   Signed: Prince Rome 07/18/2012, 2:39 PM

## 2012-07-18 NOTE — Discharge Summary (Signed)
  Addendum discharge summary:  [facility] will provide physical therapy upon discharge.

## 2012-07-18 NOTE — Evaluation (Signed)
Occupational Therapy Evaluation and Discharge Patient Details Name: Lori Curtis MRN: [id number] DOB: 06/18/1951 Today's Date: 07/18/2012 Time: 1478-2956 OT Time Calculation (min): 14 min  OT Assessment / Plan / Recommendation Clinical Impression  This 61 yo s/p LTHA presents to acute OT with all education completed. Will D/C from acute OT.    OT Assessment  Patient does not need any further OT services    Follow Up Recommendations  No OT follow up    Barriers to Discharge      Equipment Recommendations  None recommended by OT    Recommendations for Other Services    Frequency       Precautions / Restrictions Precautions Precautions: Knee Precaution Booklet Issued: No Restrictions Weight Bearing Restrictions: Yes LLE Weight Bearing: Weight bearing as tolerated   Pertinent Vitals/Pain 6/10 left knee    ADL  Eating/Feeding: Simulated;Independent Where Assessed - Eating/Feeding: Edge of bed Grooming: Simulated;Set up Where Assessed - Grooming: Unsupported sitting Upper Body Bathing: Simulated;Set up Where Assessed - Upper Body Bathing: Unsupported sitting Lower Body Bathing: Simulated;Moderate assistance Where Assessed - Lower Body Bathing: Supported sit to stand Upper Body Dressing: Simulated;Set up Where Assessed - Upper Body Dressing: Unsupported sitting Lower Body Dressing: Simulated;Maximal assistance (Pt withh great difficulty with raising LLE off floor) Where Assessed - Lower Body Dressing: Supported sit to stand ADL Comments: Pt aware that whenever she puts LB clothes on or someone helps her she needs to put LLE in first and take out last. Walk in shower has level entry and is big so she can actually walk in forward with her RW and then turn around to sit. Will have A from friend  24/7    OT Diagnosis:    OT Problem List:   OT Treatment Interventions:     OT Goals    Visit Information  Last OT Received On: 07/18/12 Assistance Needed: +1    Subjective  Data  Subjective: My girlfriend will be able to help me   Prior Functioning  Vision/Perception  Home Living Lives With: Spouse Available Help at Discharge: Friend(s);Available 24 hours/day Type of Home: House Home Access: Stairs to enter [employer] of Steps: 3 Entrance Stairs-Rails: None Home Layout: One level Bathroom Shower/Tub: Walk-in shower;Door (large shower, level entry) Bathroom Toilet: Standard Home Adaptive Equipment: Bedside commode/3-in-1;Walker - rolling;Shower chair with back Prior Function Level of Independence: Independent Able to Take Stairs?: Yes Driving: Yes Vocation: Full time employment Communication Communication: No difficulties Dominant Hand: Right      Cognition  Overall Cognitive Status: Appears within functional limits for tasks assessed/performed Arousal/Alertness: Awake/alert Orientation Level: Appears intact for tasks assessed Behavior During Session: [facility] for tasks performed                  End of Session OT - End of Session Activity Tolerance: Patient tolerated treatment well Patient left: in chair;with call bell/phone within reach       Evette Georges [phone] 07/18/2012, 10:14 AM

## 2012-07-18 NOTE — Progress Notes (Signed)
Physical Therapy Treatment Patient Details Name: Lori Curtis MRN: [id number] DOB: 08/05/1951 Today's Date: 07/18/2012 Time: 7829-5621 PT Time Calculation (min): 36 min  PT Assessment / Plan / Recommendation Comments on Treatment Session  Pt able to increase ambulation distance and perform stair negotiation.  Pt able to complete HEP.  Pt given stair handout.    Follow Up Recommendations  Home health PT    Barriers to Discharge        Equipment Recommendations  None recommended by PT    Recommendations for Other Services    Frequency 7X/week   Plan Discharge plan remains appropriate;Frequency remains appropriate    Precautions / Restrictions Precautions Precautions: Knee Precaution Booklet Issued: No Restrictions Weight Bearing Restrictions: Yes LLE Weight Bearing: Weight bearing as tolerated   Pertinent Vitals/Pain 5/10 left knee    Mobility  Transfers Transfers: Sit to Stand;Stand to Sit Sit to Stand: 4: Min guard;From chair/3-in-1 Stand to Sit: 4: Min guard;To chair/3-in-1 Details for Transfer Assistance: Minguard for safety with cues for hand placement and proper left LE placement Ambulation/Gait Ambulation/Gait Assistance: 4: Min guard Ambulation Distance (Feet): 150 Feet Assistive device: Rolling walker Ambulation/Gait Assistance Details: Minguard for safety with cues for step sequence and to improve step through gait. Gait Pattern: Step-to pattern;Step-through pattern;Shuffle;Antalgic Stairs: Yes Stairs Assistance: 4: Min assist Stairs Assistance Details (indicate cue type and reason): (A) to maintain balance and manage RW  Stair Management Technique: Backwards;With walker Number of Stairs: 3     Exercises Total Joint Exercises Ankle Circles/Pumps: AROM;Left;10 reps;Supine Quad Sets: AROM;Left;10 reps;Supine Short Arc Quad: Strengthening;Left;10 reps Heel Slides: Strengthening;Left;10 reps Hip ABduction/ADduction: Strengthening;Left;5 reps Straight Leg  Raises: Strengthening;Left;5 reps   PT Diagnosis:    PT Problem List:   PT Treatment Interventions:     PT Goals Acute Rehab PT Goals PT Goal Formulation: With patient Time For Goal Achievement: 07/24/12 Potential to Achieve Goals: Good Pt will go Sit to Stand: with modified independence PT Goal: Sit to Stand - Progress: Progressing toward goal Pt will go Stand to Sit: with modified independence PT Goal: Stand to Sit - Progress: Progressing toward goal Pt will Ambulate: >150 feet;with modified independence;with least restrictive assistive device PT Goal: Ambulate - Progress: Progressing toward goal Pt will Go Up / Down Stairs: 3-5 stairs;with min assist;with least restrictive assistive device PT Goal: Up/Down Stairs - Progress: Partly met Pt will Perform Home Exercise Program: Independently PT Goal: Perform Home Exercise Program - Progress: Progressing toward goal  Visit Information  Last PT Received On: 07/18/12 Assistance Needed: +1    Subjective Data  Subjective: "I'm ready for you this morning."   Cognition  Overall Cognitive Status: Appears within functional limits for tasks assessed/performed Arousal/Alertness: Awake/alert Orientation Level: Appears intact for tasks assessed Behavior During Session: [facility] for tasks performed    Balance     End of Session PT - End of Session Equipment Utilized During Treatment: Gait belt Activity Tolerance: Patient tolerated treatment well Patient left: in chair;with call bell/phone within reach;with family/visitor present Nurse Communication: Mobility status   GP     Lori Curtis 07/18/2012, 8:50 AM Lori Curtis, PT DPT [phone]

## 2012-11-18 ENCOUNTER — Other Ambulatory Visit: Payer: Self-pay | Admitting: Orthopaedic Surgery

## 2012-11-18 ENCOUNTER — Encounter (HOSPITAL_COMMUNITY): Payer: Self-pay

## 2012-11-20 ENCOUNTER — Encounter (HOSPITAL_COMMUNITY): Payer: Self-pay

## 2012-11-20 ENCOUNTER — Encounter (HOSPITAL_COMMUNITY)
Admission: RE | Admit: 2012-11-20 | Discharge: 2012-11-20 | Disposition: A | Discharge status: Home / Self Care | Payer: BC Managed Care – PPO | Source: Ambulatory Visit | Attending: Orthopaedic Surgery | Admitting: Orthopaedic Surgery

## 2012-11-20 LAB — BASIC METABOLIC PANEL
BUN: 18 mg/dL (ref 6–23)
CO2: 25 mEq/L (ref 19–32)
Calcium: 9.9 mg/dL (ref 8.4–10.5)
GFR calc non Af Amer: >90 mL/min (ref >90)
Glucose, Bld: 100 mg/dL — ABNORMAL HIGH (ref 70–99)
Sodium: 140 mEq/L (ref 135–145)

## 2012-11-20 LAB — CBC WITH DIFFERENTIAL/PLATELET
Basophils Relative: 1 % (ref 0–1)
Eosinophils Absolute: 0.2 10*3/uL (ref 0.0–0.7)
Eosinophils Relative: 3 % (ref 0–5)
Lymphs Abs: 2.5 10*3/uL (ref 0.7–4.0)
MCH: 30 pg (ref 26.0–34.0)
MCHC: 33.9 g/dL (ref 30.0–36.0)
MCV: 88.3 fL (ref 78.0–100.0)
Monocytes Relative: 8 % (ref 3–12)
Platelets: 118 10*3/uL — ABNORMAL LOW (ref 150–400)
RBC: 5.54 MIL/uL — ABNORMAL HIGH (ref 3.87–5.11)

## 2012-11-20 LAB — TYPE AND SCREEN

## 2012-11-20 LAB — URINALYSIS, ROUTINE W REFLEX MICROSCOPIC
Glucose, UA: NEGATIVE mg/dL
Leukocytes, UA: NEGATIVE
Nitrite: NEGATIVE
Specific Gravity, Urine: 1.023 (ref 1.005–1.030)
pH: 5.5 (ref 5.0–8.0)

## 2012-11-20 LAB — PROTIME-INR: Prothrombin Time: 12.5 seconds (ref 11.6–15.2)

## 2012-11-20 LAB — SURGICAL PCR SCREEN: Staphylococcus aureus: NEGATIVE

## 2012-11-20 NOTE — Pre-Procedure Instructions (Signed)
20 Lori Curtis  11/20/2012   Your procedure is scheduled on:  December 10  Report to [facility] at 08:30 AM.  Call this number if you have problems the morning of surgery: [phone]   Remember:   Do not eat or drink:After Midnight.  Take these medicines the morning of surgery with A SIP OF WATER: Amlodipine, Metoprolol, Tapazole   Do not wear jewelry, make-up or nail polish.  Do not wear lotions, powders, or perfumes. You may wear deodorant.  Do not shave 48 hours prior to surgery. Men may shave face and neck.  Do not bring valuables to the hospital.  Contacts, dentures or bridgework may not be worn into surgery.  Leave suitcase in the car. After surgery it may be brought to your room.  For patients admitted to the hospital, checkout time is 11:00 AM the day of discharge.   Patients discharged the day of surgery will not be allowed to drive home.  Special Instructions: Incentive Spirometry - Practice and bring it with you on the day of surgery. Shower using CHG 2 nights before surgery and the night before surgery.  If you shower the day of surgery use CHG.  Use special wash - you have one bottle of CHG for all showers.  You should use approximately 1/3 of the bottle for each shower.   Please read over the following fact sheets that you were given: Pain Booklet, Coughing and Deep Breathing, Blood Transfusion Information, Total Joint Packet and Surgical Site Infection Prevention

## 2012-11-22 NOTE — H&P (Signed)
TOTAL KNEE ADMISSION H&P  Patient is being admitted for right total knee arthroplasty.  Subjective:  Chief Complaint:right knee pain.  HPI: Lori Curtis, 61 y.o. female, has a history of pain and functional disability in the right knee due to arthritis and has failed non-surgical conservative treatments for greater than 12 weeks to includeNSAID's and/or analgesics, corticosteriod injections, flexibility and strengthening excercises, supervised PT with diminished ADL's post treatment, use of assistive devices and activity modification.  Onset of symptoms was gradual, starting 4 years ago with gradually worsening course since that time. The patient noted has had a previous arthroscopy in 2012 on the right knee(s).  Patient currently rates pain in the right knee(s) at 8 out of 10 with activity. Patient has night pain, worsening of pain with activity and weight bearing, pain that interferes with activities of daily living, pain with passive range of motion, crepitus and joint swelling.  Patient has evidence of subchondral sclerosis, periarticular osteophytes and joint space narrowing by imaging studies. This patient has had previous arthroscopy in 2012. There is no active infection.  Patient Active Problem List   Diagnosis Date Noted  . 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 Hypertension   . Hyperthyroidism     graves disease  . Shortness of breath   . GERD (gastroesophageal reflux disease)   . Arthritis     Past Surgical History  Procedure Date  . Tonsillectomy   . Total knee arthroplasty 07/16/2012    Procedure: TOTAL KNEE ARTHROPLASTY;  Surgeon: Velna Ochs, MD;  Location: [facility] OR;  Service: Orthopedics;  Laterality: Left;    No prescriptions prior to admission   Allergies  Allergen Reactions  . Amoxicillin Hives    Only tablets cause reaction  . Methocarbamol Itching    History  Substance Use Topics  .  Smoking status: Current Every Day Smoker  . Smokeless tobacco: Not on file     Comment: Uses electronic cig. will occ. smoke when nervous  . Alcohol Use: No    No family history on file.   Review of Systems  Constitutional: Negative.   HENT: Negative.   Eyes: Negative.   Respiratory: Negative.   Cardiovascular: Negative.   Gastrointestinal: Negative.   Genitourinary: Negative.   Musculoskeletal: Negative.   Skin: Negative.   Neurological: Negative.   Endo/Heme/Allergies: Negative.   Psychiatric/Behavioral: Negative.     Objective:  Physical Exam  Constitutional: She is oriented to person, place, and time. She appears well-nourished.  HENT:  Head: Atraumatic.  Eyes: Pupils are equal, round, and reactive to light.  Neck: Neck supple.  Cardiovascular: Regular rhythm.   Respiratory: Breath sounds normal.  GI: Bowel sounds are normal.  Musculoskeletal:       Right knee exam: Walks with an antalgic gait.  Range of motion 0-1 25.  Significant pain along the medial joint line and patellofemoral area.  Trace effusion.  Neurological: She is oriented to person, place, and time.  Skin: Skin is dry.  Psychiatric: She has a normal mood and affect.    Vital signs in last 24 hours:    Labs:   Estimated Body mass index is 30.08 kg/(m^2) as calculated from the following:   Height as of 07/11/12: 5\' 1" (1.549 m).   Weight as of 07/11/12: 159 lb 3.2 oz(72.213 kg).   Imaging Review Plain radiographs demonstrate severe degenerative joint disease of the right knee(s). The  overall alignment isneutral. The bone quality appears to be good for age and reported activity level.  Assessment/Plan:  End stage arthritis, right knee   The patient history, physical examination, clinical judgment of the provider and imaging studies are consistent with end stage degenerative joint disease of the right knee(s) and total knee arthroplasty is deemed medically necessary. The treatment options including  medical management, injection therapy arthroscopy and arthroplasty were discussed at length. The risks and benefits of total knee arthroplasty were presented and reviewed. The risks due to aseptic loosening, infection, stiffness, patella tracking problems, thromboembolic complications and other imponderables were discussed. The patient acknowledged the explanation, agreed to proceed with the plan and consent was signed. Patient is being admitted for inpatient treatment for surgery, pain control, PT, OT, prophylactic antibiotics, VTE prophylaxis, progressive ambulation and ADL's and discharge planning. The patient is planning to be discharged home with home health services

## 2012-11-25 MED ORDER — VANCOMYCIN HCL IN DEXTROSE 1-5 GM/200ML-% IV SOLN
1000.0000 mg | INTRAVENOUS | Status: AC
  Administered 2012-11-26: 1000 mg via INTRAVENOUS

## 2012-11-26 ENCOUNTER — Encounter (HOSPITAL_COMMUNITY): Payer: Self-pay | Admitting: Anesthesiology

## 2012-11-26 ENCOUNTER — Encounter (HOSPITAL_COMMUNITY)
Admission: RE | Disposition: A | Discharge status: Home Health | Payer: Self-pay | Source: Ambulatory Visit | Attending: Orthopaedic Surgery

## 2012-11-26 ENCOUNTER — Inpatient Hospital Stay (HOSPITAL_COMMUNITY)
Admission: RE | Admit: 2012-11-26 | Discharge: 2012-11-28 | DRG: 209 | Disposition: A | Discharge status: Home Health | Payer: BC Managed Care – PPO | Source: Ambulatory Visit | Attending: Orthopaedic Surgery | Admitting: Orthopaedic Surgery

## 2012-11-26 ENCOUNTER — Ambulatory Visit (HOSPITAL_COMMUNITY): Payer: BC Managed Care – PPO | Admitting: Anesthesiology

## 2012-11-26 DIAGNOSIS — Z888 Allergy status to other drugs, medicaments and biological substances status: Secondary | ICD-10-CM

## 2012-11-26 DIAGNOSIS — J4489 Other specified chronic obstructive pulmonary disease: Secondary | ICD-10-CM | POA: Diagnosis present

## 2012-11-26 DIAGNOSIS — F17200 Nicotine dependence, unspecified, uncomplicated: Secondary | ICD-10-CM | POA: Diagnosis present

## 2012-11-26 DIAGNOSIS — Z7982 Long term (current) use of aspirin: Secondary | ICD-10-CM

## 2012-11-26 DIAGNOSIS — J449 Chronic obstructive pulmonary disease, unspecified: Secondary | ICD-10-CM | POA: Diagnosis present

## 2012-11-26 DIAGNOSIS — Z9089 Acquired absence of other organs: Secondary | ICD-10-CM

## 2012-11-26 DIAGNOSIS — Z881 Allergy status to other antibiotic agents status: Secondary | ICD-10-CM

## 2012-11-26 DIAGNOSIS — Z6830 Body mass index (BMI) 30.0-30.9, adult: Secondary | ICD-10-CM

## 2012-11-26 DIAGNOSIS — Z96659 Presence of unspecified artificial knee joint: Secondary | ICD-10-CM

## 2012-11-26 DIAGNOSIS — M1711 Unilateral primary osteoarthritis, right knee: Secondary | ICD-10-CM | POA: Diagnosis present

## 2012-11-26 DIAGNOSIS — K219 Gastro-esophageal reflux disease without esophagitis: Secondary | ICD-10-CM | POA: Diagnosis present

## 2012-11-26 DIAGNOSIS — Z79899 Other long term (current) drug therapy: Secondary | ICD-10-CM

## 2012-11-26 DIAGNOSIS — E0500 Thyrotoxicosis with diffuse goiter without thyrotoxic crisis or storm: Secondary | ICD-10-CM | POA: Diagnosis present

## 2012-11-26 DIAGNOSIS — M1710 Unilateral primary osteoarthritis, unspecified knee: Principal | ICD-10-CM | POA: Diagnosis present

## 2012-11-26 DIAGNOSIS — I10 Essential (primary) hypertension: Secondary | ICD-10-CM | POA: Diagnosis present

## 2012-11-26 HISTORY — DX: Thyrotoxicosis with diffuse goiter without thyrotoxic crisis or storm: E05.00

## 2012-11-26 HISTORY — PX: TOTAL KNEE ARTHROPLASTY: SHX125

## 2012-11-26 SURGERY — ARTHROPLASTY, KNEE, TOTAL
Anesthesia: General | Site: Knee | Laterality: Right | Wound class: Clean

## 2012-11-26 MED ORDER — LACTATED RINGERS IV SOLN
INTRAVENOUS | Status: DC
  Administered 2012-11-26 – 2012-11-27 (×2): via INTRAVENOUS

## 2012-11-26 MED ORDER — METAXALONE 800 MG PO TABS
800.0000 mg | ORAL_TABLET | Freq: Two times a day (BID) | ORAL | Status: DC | PRN
  Administered 2012-11-26 – 2012-11-27 (×2): 800 mg via ORAL
  Filled 2012-11-26 (×2): qty 1

## 2012-11-26 MED ORDER — ACETAMINOPHEN 10 MG/ML IV SOLN
INTRAVENOUS | Status: DC | PRN
  Administered 2012-11-26: 1000 mg via INTRAVENOUS

## 2012-11-26 MED ORDER — ARTIFICIAL TEARS OP OINT
TOPICAL_OINTMENT | OPHTHALMIC | Status: DC | PRN
  Administered 2012-11-26: 1 via OPHTHALMIC

## 2012-11-26 MED ORDER — HYDROMORPHONE HCL PF 1 MG/ML IJ SOLN
0.2500 mg | INTRAMUSCULAR | Status: DC | PRN
  Administered 2012-11-26 (×2): 0.5 mg via INTRAVENOUS

## 2012-11-26 MED ORDER — PROMETHAZINE HCL 25 MG/ML IJ SOLN: 6.2500 mg | INTRAMUSCULAR | Status: DC | PRN

## 2012-11-26 MED ORDER — FENTANYL CITRATE 0.05 MG/ML IJ SOLN
INTRAMUSCULAR | Status: AC
  Filled 2012-11-26: qty 2

## 2012-11-26 MED ORDER — MEPERIDINE HCL 25 MG/ML IJ SOLN: 6.2500 mg | INTRAMUSCULAR | Status: DC | PRN

## 2012-11-26 MED ORDER — VANCOMYCIN HCL IN DEXTROSE 1-5 GM/200ML-% IV SOLN
1000.0000 mg | Freq: Two times a day (BID) | INTRAVENOUS | Status: AC
  Administered 2012-11-26: 1000 mg via INTRAVENOUS
  Filled 2012-11-26 (×2): qty 200

## 2012-11-26 MED ORDER — ONDANSETRON HCL 4 MG/2ML IJ SOLN: 4.0000 mg | Freq: Four times a day (QID) | INTRAMUSCULAR | Status: DC | PRN

## 2012-11-26 MED ORDER — METHIMAZOLE 5 MG PO TABS
2.5000 mg | ORAL_TABLET | Freq: Every day | ORAL | Status: DC
  Administered 2012-11-26 – 2012-11-27 (×2): 2.5 mg via ORAL
  Filled 2012-11-26 (×3): qty 1

## 2012-11-26 MED ORDER — DEXTROSE 5 % IV SOLN
INTRAVENOUS | Status: DC | PRN
  Administered 2012-11-26: 11:00:00 via INTRAVENOUS

## 2012-11-26 MED ORDER — ACETAMINOPHEN 10 MG/ML IV SOLN
INTRAVENOUS | Status: AC
  Filled 2012-11-26: qty 100

## 2012-11-26 MED ORDER — ONDANSETRON HCL 4 MG PO TABS: 4.0000 mg | ORAL_TABLET | Freq: Four times a day (QID) | ORAL | Status: DC | PRN

## 2012-11-26 MED ORDER — PHENOL 1.4 % MT LIQD: 1.0000 | OROMUCOSAL | Status: DC | PRN

## 2012-11-26 MED ORDER — METOCLOPRAMIDE HCL 10 MG PO TABS: 5.0000 mg | ORAL_TABLET | Freq: Three times a day (TID) | ORAL | Status: DC | PRN

## 2012-11-26 MED ORDER — LIDOCAINE HCL (CARDIAC) 20 MG/ML IV SOLN
INTRAVENOUS | Status: DC | PRN
  Administered 2012-11-26: 25 mg via INTRAVENOUS

## 2012-11-26 MED ORDER — AMLODIPINE BESYLATE 10 MG PO TABS
10.0000 mg | ORAL_TABLET | Freq: Every day | ORAL | Status: DC
  Administered 2012-11-27 – 2012-11-28 (×2): 10 mg via ORAL
  Filled 2012-11-26 (×2): qty 1

## 2012-11-26 MED ORDER — FENTANYL CITRATE 0.05 MG/ML IJ SOLN
INTRAMUSCULAR | Status: DC | PRN
  Administered 2012-11-26: 25 ug via INTRAVENOUS
  Administered 2012-11-26: 50 ug via INTRAVENOUS
  Administered 2012-11-26: 100 ug via INTRAVENOUS
  Administered 2012-11-26 (×3): 25 ug via INTRAVENOUS

## 2012-11-26 MED ORDER — HYDROMORPHONE HCL PF 1 MG/ML IJ SOLN
INTRAMUSCULAR | Status: AC
  Filled 2012-11-26: qty 1

## 2012-11-26 MED ORDER — MIDAZOLAM HCL 2 MG/2ML IJ SOLN: 0.5000 mg | Freq: Once | INTRAMUSCULAR | Status: DC | PRN

## 2012-11-26 MED ORDER — SIMVASTATIN 40 MG PO TABS: 40.0000 mg | ORAL_TABLET | Freq: Every evening | ORAL | Status: DC

## 2012-11-26 MED ORDER — METOPROLOL SUCCINATE ER 100 MG PO TB24
100.0000 mg | ORAL_TABLET | Freq: Every day | ORAL | Status: DC
  Administered 2012-11-27 – 2012-11-28 (×2): 100 mg via ORAL
  Filled 2012-11-26 (×2): qty 1

## 2012-11-26 MED ORDER — FENTANYL CITRATE 0.05 MG/ML IJ SOLN
50.0000 ug | INTRAMUSCULAR | Status: DC | PRN
  Administered 2012-11-26: 100 ug via INTRAVENOUS

## 2012-11-26 MED ORDER — ACETAMINOPHEN 650 MG RE SUPP: 650.0000 mg | Freq: Four times a day (QID) | RECTAL | Status: DC | PRN

## 2012-11-26 MED ORDER — OXYCODONE HCL 5 MG PO TABS
ORAL_TABLET | ORAL | Status: AC
  Filled 2012-11-26: qty 1

## 2012-11-26 MED ORDER — TOBRAMYCIN SULFATE 1.2 G IJ SOLR
INTRAMUSCULAR | Status: DC | PRN
  Administered 2012-11-26: 1.2 g

## 2012-11-26 MED ORDER — METHIMAZOLE 5 MG PO TABS
5.0000 mg | ORAL_TABLET | Freq: Every day | ORAL | Status: DC
  Administered 2012-11-27 – 2012-11-28 (×2): 5 mg via ORAL
  Filled 2012-11-26 (×2): qty 1

## 2012-11-26 MED ORDER — MIDAZOLAM HCL 2 MG/2ML IJ SOLN
INTRAMUSCULAR | Status: AC
  Filled 2012-11-26: qty 2

## 2012-11-26 MED ORDER — LACTATED RINGERS IV SOLN
INTRAVENOUS | Status: DC | PRN
  Administered 2012-11-26 (×2): via INTRAVENOUS

## 2012-11-26 MED ORDER — SODIUM CHLORIDE 0.9 % IR SOLN
Status: DC | PRN
  Administered 2012-11-26: 3000 mL

## 2012-11-26 MED ORDER — TOBRAMYCIN SULFATE 1.2 G IJ SOLR
INTRAMUSCULAR | Status: AC
  Filled 2012-11-26: qty 1.2

## 2012-11-26 MED ORDER — MIDAZOLAM HCL 2 MG/2ML IJ SOLN
1.0000 mg | INTRAMUSCULAR | Status: DC | PRN
  Administered 2012-11-26: 2 mg via INTRAVENOUS

## 2012-11-26 MED ORDER — OXYCODONE-ACETAMINOPHEN 5-325 MG PO TABS
1.0000 | ORAL_TABLET | ORAL | Status: DC | PRN
  Administered 2012-11-26 – 2012-11-27 (×3): 2 via ORAL
  Administered 2012-11-27: 1 via ORAL
  Administered 2012-11-27 – 2012-11-28 (×7): 2 via ORAL
  Filled 2012-11-26 (×12): qty 2

## 2012-11-26 MED ORDER — ONDANSETRON HCL 4 MG/2ML IJ SOLN
INTRAMUSCULAR | Status: DC | PRN
  Administered 2012-11-26: 4 mg via INTRAVENOUS

## 2012-11-26 MED ORDER — ASPIRIN EC 325 MG PO TBEC
325.0000 mg | DELAYED_RELEASE_TABLET | Freq: Two times a day (BID) | ORAL | Status: DC
  Administered 2012-11-27 – 2012-11-28 (×3): 325 mg via ORAL
  Filled 2012-11-26 (×5): qty 1

## 2012-11-26 MED ORDER — ACETAMINOPHEN 325 MG PO TABS: 650.0000 mg | ORAL_TABLET | Freq: Four times a day (QID) | ORAL | Status: DC | PRN

## 2012-11-26 MED ORDER — BUPIVACAINE-EPINEPHRINE PF 0.5-1:200000 % IJ SOLN
INTRAMUSCULAR | Status: DC | PRN
  Administered 2012-11-26: 30 mL

## 2012-11-26 MED ORDER — METHIMAZOLE 5 MG PO TABS: 2.5000 mg | ORAL_TABLET | Freq: Two times a day (BID) | ORAL | Status: DC

## 2012-11-26 MED ORDER — ATORVASTATIN CALCIUM 20 MG PO TABS
20.0000 mg | ORAL_TABLET | Freq: Every day | ORAL | Status: DC
  Administered 2012-11-26 – 2012-11-27 (×2): 20 mg via ORAL
  Filled 2012-11-26 (×3): qty 1

## 2012-11-26 MED ORDER — HYDROMORPHONE HCL PF 1 MG/ML IJ SOLN
0.5000 mg | INTRAMUSCULAR | Status: DC | PRN
  Administered 2012-11-27 (×3): 1 mg via INTRAVENOUS
  Filled 2012-11-26 (×2): qty 1

## 2012-11-26 MED ORDER — OXYCODONE HCL 5 MG/5ML PO SOLN: 5.0000 mg | Freq: Once | ORAL | Status: AC | PRN

## 2012-11-26 MED ORDER — MENTHOL 3 MG MT LOZG
1.0000 | LOZENGE | OROMUCOSAL | Status: DC | PRN
  Administered 2012-11-27: 3 mg via ORAL
  Filled 2012-11-26: qty 9

## 2012-11-26 MED ORDER — PROPOFOL 10 MG/ML IV BOLUS
INTRAVENOUS | Status: DC | PRN
  Administered 2012-11-26 (×2): 20 mg via INTRAVENOUS
  Administered 2012-11-26: 160 mg via INTRAVENOUS

## 2012-11-26 MED ORDER — CHLORHEXIDINE GLUCONATE 4 % EX LIQD: 60.0000 mL | Freq: Once | CUTANEOUS | Status: DC

## 2012-11-26 MED ORDER — LACTATED RINGERS IV SOLN
INTRAVENOUS | Status: DC
  Administered 2012-11-26: 10:00:00 via INTRAVENOUS

## 2012-11-26 MED ORDER — HYDROCODONE-ACETAMINOPHEN 5-325 MG PO TABS
1.0000 | ORAL_TABLET | ORAL | Status: DC | PRN
Start: 2012-11-26 — End: 2012-11-26
  Filled 2012-11-26: qty 1

## 2012-11-26 MED ORDER — METOCLOPRAMIDE HCL 5 MG/ML IJ SOLN: 5.0000 mg | Freq: Three times a day (TID) | INTRAMUSCULAR | Status: DC | PRN

## 2012-11-26 MED ORDER — DIPHENHYDRAMINE HCL 12.5 MG/5ML PO ELIX
12.5000 mg | ORAL_SOLUTION | ORAL | Status: DC | PRN
  Administered 2012-11-27 (×2): 12.5 mg via ORAL
  Filled 2012-11-26 (×2): qty 10

## 2012-11-26 MED ORDER — OXYCODONE HCL 5 MG PO TABS
5.0000 mg | ORAL_TABLET | Freq: Once | ORAL | Status: AC | PRN
  Administered 2012-11-26: 5 mg via ORAL

## 2012-11-26 SURGICAL SUPPLY — 58 items
BANDAGE ELASTIC 4 VELCRO ST LF (GAUZE/BANDAGES/DRESSINGS) ×2 IMPLANT
BANDAGE ESMARK 6X9 LF (GAUZE/BANDAGES/DRESSINGS) ×1 IMPLANT
BANDAGE GAUZE ELAST BULKY 4 IN (GAUZE/BANDAGES/DRESSINGS) ×4 IMPLANT
BLADE SAGITTAL 25.0X1.19X90 (BLADE) ×2 IMPLANT
BLADE SURG ROTATE 9660 (MISCELLANEOUS) IMPLANT
BNDG ELASTIC 6X10 VLCR STRL LF (GAUZE/BANDAGES/DRESSINGS) ×2 IMPLANT
BNDG ESMARK 6X9 LF (GAUZE/BANDAGES/DRESSINGS) ×2
BOWL SMART MIX CTS (DISPOSABLE) ×2 IMPLANT
CEMENT HV SMART SET (Cement) ×4 IMPLANT
CLOTH BEACON ORANGE TIMEOUT ST (SAFETY) ×2 IMPLANT
COVER SURGICAL LIGHT HANDLE (MISCELLANEOUS) ×2 IMPLANT
CUFF TOURNIQUET SINGLE 34IN LL (TOURNIQUET CUFF) ×2 IMPLANT
CUFF TOURNIQUET SINGLE 44IN (TOURNIQUET CUFF) IMPLANT
DRAPE EXTREMITY T 121X128X90 (DRAPE) ×2 IMPLANT
DRAPE PROXIMA HALF (DRAPES) ×2 IMPLANT
DRAPE U-SHAPE 47X51 STRL (DRAPES) ×2 IMPLANT
DRSG ADAPTIC 3X8 NADH LF (GAUZE/BANDAGES/DRESSINGS) ×2 IMPLANT
DRSG PAD ABDOMINAL 8X10 ST (GAUZE/BANDAGES/DRESSINGS) ×2 IMPLANT
DURAPREP 26ML APPLICATOR (WOUND CARE) ×2 IMPLANT
ELECT REM PT RETURN 9FT ADLT (ELECTROSURGICAL) ×2
ELECTRODE REM PT RTRN 9FT ADLT (ELECTROSURGICAL) ×1 IMPLANT
FACESHIELD LNG OPTICON STERILE (SAFETY) ×4 IMPLANT
GLOVE BIO SURGEON STRL SZ8.5 (GLOVE) ×2 IMPLANT
GLOVE BIOGEL PI IND STRL 8 (GLOVE) ×1 IMPLANT
GLOVE BIOGEL PI IND STRL 8.5 (GLOVE) ×1 IMPLANT
GLOVE BIOGEL PI INDICATOR 8 (GLOVE) ×1
GLOVE BIOGEL PI INDICATOR 8.5 (GLOVE) ×1
GLOVE SS BIOGEL STRL SZ 8 (GLOVE) ×1 IMPLANT
GLOVE SUPERSENSE BIOGEL SZ 8 (GLOVE) ×1
GOWN PREVENTION PLUS XLARGE (GOWN DISPOSABLE) ×2 IMPLANT
GOWN PREVENTION PLUS XXLARGE (GOWN DISPOSABLE) ×2 IMPLANT
GOWN STRL NON-REIN LRG LVL3 (GOWN DISPOSABLE) ×2 IMPLANT
HANDPIECE INTERPULSE COAX TIP (DISPOSABLE) ×1
HOOD PEEL AWAY FACE SHEILD DIS (HOOD) ×2 IMPLANT
IMMOBILIZER KNEE 20 (SOFTGOODS) ×2
IMMOBILIZER KNEE 20 THIGH 36 (SOFTGOODS) ×1 IMPLANT
IMMOBILIZER KNEE 22 UNIV (SOFTGOODS) IMPLANT
IMMOBILIZER KNEE 24 THIGH 36 (MISCELLANEOUS) IMPLANT
IMMOBILIZER KNEE 24 UNIV (MISCELLANEOUS)
KIT BASIN OR (CUSTOM PROCEDURE TRAY) ×2 IMPLANT
KIT ROOM TURNOVER OR (KITS) ×2 IMPLANT
MANIFOLD NEPTUNE II (INSTRUMENTS) ×2 IMPLANT
NS IRRIG 1000ML POUR BTL (IV SOLUTION) ×2 IMPLANT
PACK TOTAL JOINT (CUSTOM PROCEDURE TRAY) ×2 IMPLANT
PAD ARMBOARD 7.5X6 YLW CONV (MISCELLANEOUS) ×4 IMPLANT
SET HNDPC FAN SPRY TIP SCT (DISPOSABLE) ×1 IMPLANT
SPONGE GAUZE 4X4 12PLY (GAUZE/BANDAGES/DRESSINGS) ×2 IMPLANT
STAPLER VISISTAT 35W (STAPLE) ×2 IMPLANT
SUCTION FRAZIER TIP 10 FR DISP (SUCTIONS) IMPLANT
SUT VIC AB 0 CT1 27 (SUTURE) ×2
SUT VIC AB 0 CT1 27XBRD ANBCTR (SUTURE) ×2 IMPLANT
SUT VIC AB 2-0 CT1 27 (SUTURE) ×2
SUT VIC AB 2-0 CT1 TAPERPNT 27 (SUTURE) ×2 IMPLANT
SUT VLOC 180 0 24IN GS25 (SUTURE) ×2 IMPLANT
TOWEL OR 17X24 6PK STRL BLUE (TOWEL DISPOSABLE) ×2 IMPLANT
TOWEL OR 17X26 10 PK STRL BLUE (TOWEL DISPOSABLE) ×2 IMPLANT
TRAY FOLEY CATH 14FR (SET/KITS/TRAYS/PACK) ×2 IMPLANT
WATER STERILE IRR 1000ML POUR (IV SOLUTION) ×6 IMPLANT

## 2012-11-26 NOTE — Op Note (Signed)
PREOP DIAGNOSIS: DJD RIGHT KNEE POSTOP DIAGNOSIS: DJD RIGHT KNEE PROCEDURE: RIGHT TKR ANESTHESIA: General and block ATTENDING SURGEON: Atianna Haidar G ASSISTANT: Lindwood Qua PA  INDICATIONS FOR PROCEDURE: Lori Curtis is a 61 y.o. female who has struggled for a long time with pain due to degenerative arthritis of the right knee.  The patient has failed many conservative non-operative measures and at this point has pain which limits the ability to sleep and walk.  The patient is offered total knee replacement.  Informed operative consent was obtained after discussion of possible risks of anesthesia, infection, neurovascular injury, DVT, and death.  The importance of the post-operative rehabilitation protocol to optimize result was stressed extensively with the patient.  SUMMARY OF FINDINGS AND PROCEDURE:  Lori Curtis was taken to the operative suite where under the above anesthesia a right knee replacement was performed.  There were advanced degenerative changes and the bone quality was good.  We used the DePuy system and placed size standard femur, 2.5 tibia, 35 mm all polyethylene patella, and a size 10 spacer.  I did include tobramycin antibiotic in the cement.  The patient was admitted for appropriate post-op care to include perioperative antibiotics and mechanical and pharmacologic measures for DVT prophylaxis.  DESCRIPTION OF PROCEDURE:  Lori Curtis was taken to the operative suite where the above anesthesia was applied.  The patient was positioned supine and prepped and draped in normal sterile fashion.  An appropriate time out was performed.  After the administration of vancomycin pre-op antibiotic the leg was elevated and exsanguinated and a tourniquet inflated. A standard longitudinal incision was made on the anterior knee.  Dissection was carried down to the extensor mechanism.  All appropriate anti-infective measures were used including the pre-operative antibiotic, betadine  impregnated drape, and closed hooded exhaust systems for each member of the surgical team.  A medial parapatellar incision was made in the extensor mechanism and the knee cap flipped and the knee flexed.  Some residual meniscal tissues were removed along with any remaining ACL/PCL tissue.  A guide was placed on the tibia and a flat cut was made on it's superior surface.  An intramedullary guide was placed in the femur and was utilized to make anterior and posterior cuts creating an appropriate flexion gap.  A second intramedullary guide was placed in the femur to make a distal cut properly balancing the knee with an extension gap equal to the flexion gap.  The three bones sized to the above mentioned sizes and the appropriate guides were placed and utilized.  A trial reduction was done and the knee easily came to full extension and the patella tracked well on flexion.  The trial components were removed and all bones were cleaned with pulsatile lavage and then dried thoroughly.  Cement was mixed including antibiotic and was pressurized onto the bones followed by placement of the aforementioned components.  Excess cement was trimmed and pressure was held on the components until the cement had hardened.  The tourniquet was deflated and a small amount of bleeding was controlled with cautery and pressure.  The knee was irrigated thoroughly.  The extensor mechanism was re-approximated with V-loc suture in running fashion.  The knee was flexed and the repair was solid.  The subcutaneous tissues were re-approximated with #0 and #2-0 vicryl and the skin closed with a subcuticular stitch and steristrips.  A sterile dressing was applied.  Intraoperative fluids, EBL, and tourniquet time can be obtained from anesthesia records.  DISPOSITION:  The patient  was taken to recovery room in stable condition and admitted for appropriate post-op care to include peri-operative antibiotic and DVT prophylaxis with mechanical and  pharmacologic measures.  Addis Tuohy G 11/26/2012, 12:46 PM

## 2012-11-26 NOTE — Evaluation (Signed)
Physical Therapy Evaluation Patient Details Name: Lori Curtis MRN: [id number] DOB: 27-Apr-1951 Today's Date: 11/26/2012 Time: 4098-1191 PT Time Calculation (min): 24 min  PT Assessment / Plan / Recommendation Clinical Impression  61 yo adm for Rt TKA (s/p general anesthesia with femoral nerve block). Pt s/p Lt TKR 7/13 with good understanding of progression. Will benefit from PT to incr safety with mobility and ROM Rt knee.    PT Assessment  Patient needs continued PT services    Follow Up Recommendations  Home health PT;Supervision/Assistance - 24 hour    Does the patient have the potential to tolerate intense rehabilitation      Barriers to Discharge None      Equipment Recommendations  None recommended by PT    Recommendations for Other Services     Frequency 7X/week    Precautions / Restrictions Precautions Precautions: Knee Precaution Booklet Issued: No Required Braces or Orthoses: Other Brace/Splint (KI order to d/c, however used due to femoral block) Restrictions Weight Bearing Restrictions: Yes RLE Weight Bearing: Weight bearing as tolerated   Pertinent Vitals/Pain Rt knee 9/10; repositioned, RN informed      Mobility  Bed Mobility Bed Mobility: Supine to Sit;Sitting - Scoot to Edge of Bed Supine to Sit: 4: Min assist;HOB elevated;With rails Sitting - Scoot to Edge of Bed: 4: Min assist;With rail Details for Bed Mobility Assistance: assist to move RLE in KI Transfers Transfers: Sit to Stand;Stand to Sit Sit to Stand: 4: Min guard;With upper extremity assist;From bed Stand to Sit: 4: Min guard;With upper extremity assist;With armrests;To chair/3-in-1 Details for Transfer Assistance: for safety Ambulation/Gait Ambulation/Gait Assistance: 4: Min guard Ambulation Distance (Feet): 5 Feet Assistive device: Rolling walker Ambulation/Gait Assistance Details: limited due to decr SaO2;  Gait Pattern: Step-to pattern;Decreased stride length;Antalgic     Shoulder Instructions     Exercises Total Joint Exercises Ankle Circles/Pumps: AROM;Both;10 reps;Supine   PT Diagnosis: Difficulty walking;Acute pain  PT Problem List: Decreased strength;Decreased range of motion;Decreased activity tolerance;Decreased mobility;Decreased knowledge of use of DME;Decreased knowledge of precautions;Impaired sensation;Pain PT Treatment Interventions: DME instruction;Gait training;Stair training;Functional mobility training;Therapeutic activities;Therapeutic exercise;Patient/family education   PT Goals Acute Rehab PT Goals PT Goal Formulation: With patient Time For Goal Achievement: 11/28/12 Potential to Achieve Goals: Good Pt will go Supine/Side to Sit: with modified independence;with HOB 0 degrees PT Goal: Supine/Side to Sit - Progress: Goal set today Pt will go Sit to Supine/Side: with modified independence;with HOB 0 degrees PT Goal: Sit to Supine/Side - Progress: Goal set today Pt will go Sit to Stand: with supervision;with upper extremity assist PT Goal: Sit to Stand - Progress: Goal set today Pt will go Stand to Sit: with supervision;with upper extremity assist PT Goal: Stand to Sit - Progress: Goal set today Pt will Ambulate: 51 - 150 feet;with supervision;with least restrictive assistive device PT Goal: Ambulate - Progress: Goal set today Pt will Go Up / Down Stairs: 1-2 stairs;with min assist;with least restrictive assistive device PT Goal: Up/Down Stairs - Progress: Goal set today Pt will Perform Home Exercise Program: with supervision, verbal cues required/provided PT Goal: Perform Home Exercise Program - Progress: Goal set today  Visit Information  Last PT Received On: 11/26/12 Assistance Needed: +1    Subjective Data  Subjective: Pt reports her knee feels "twisted" Patient Stated Goal: go home Thurs (POD 2)   Prior Functioning  Home Living Lives With: Spouse Available Help at Discharge: Available 24 hours/day;Family Type of Home:  House Home Access: Stairs to enter [employer]  of Steps: 1 Entrance Stairs-Rails: None Home Layout: One level Bathroom Shower/Tub: Forensic scientist: Standard Bathroom Accessibility: Yes How Accessible: Accessible via walker Home Adaptive Equipment: Walker - rolling;Bedside commode/3-in-1;Tub transfer bench;Straight cane;Crutches Prior Function Level of Independence: Independent Able to Take Stairs?: Reciprically Vocation: Full time employment Comments: [employer] Communication Communication: No difficulties Dominant Hand: Right    Cognition  Overall Cognitive Status: Appears within functional limits for tasks assessed/performed Arousal/Alertness: Lethargic Orientation Level: Oriented X4 / Intact Behavior During Session: [facility] for tasks performed    Extremity/Trunk Assessment Right Lower Extremity Assessment RLE ROM/Strength/Tone: Deficits RLE ROM/Strength/Tone Deficits: hip flexion 2+/5, ankle DF 3+/5; knee limited by pain and bulky bandage RLE Sensation: Deficits RLE Sensation Deficits: overall numbness s/p femoral nerve block; pt reports she can feel it's beginning to wear off Left Lower Extremity Assessment LLE ROM/Strength/Tone: [facility] for tasks assessed Trunk Assessment Trunk Assessment: Normal   Balance    End of Session PT - End of Session Equipment Utilized During Treatment: Gait belt Activity Tolerance: Treatment limited secondary to medical complications (Comment) Patient left: in chair;with call bell/phone within reach;with nursing in room Nurse Communication: Mobility status;Other (comment) (pt removed [place] O2 c SaO2 89-90% on RA on arrival) CPM Right Knee CPM Right Knee: On Right Knee Flexion (Degrees): 60  Right Knee Extension (Degrees): 0  Additional Comments: applied overhead frame  GP     Lori Curtis 11/26/2012, 4:11 PM  Pager [phone]

## 2012-11-26 NOTE — Interval H&P Note (Signed)
History and Physical Interval Note:  11/26/2012 10:29 AM  Lori Curtis  has presented today for surgery, with the diagnosis of RIGHT KNEE DEGENERATIVE JOINT DISEASE  The various methods of treatment have been discussed with the patient and family. After consideration of risks, benefits and other options for treatment, the patient has consented to  Procedure(s) (LRB) with comments: TOTAL KNEE ARTHROPLASTY (Right) as a surgical intervention .  The patient's history has been reviewed, patient examined, no change in status, stable for surgery.  I have reviewed the patient's chart and labs.  Questions were answered to the patient's satisfaction.     Bonnita Newby G

## 2012-11-26 NOTE — Anesthesia Preprocedure Evaluation (Signed)
Anesthesia Evaluation  Patient identified by MRN, date of birth, ID band Patient awake    Reviewed: Allergy & Precautions, H&P , NPO status , Patient's Chart, lab work & pertinent test results, reviewed documented beta blocker date and time   History of Anesthesia Complications Negative for: history of anesthetic complications  Airway Mallampati: II TM Distance: >3 FB Neck ROM: Full    Dental  (+) Partial Upper, Partial Lower, Missing and Dental Advisory Given   Pulmonary neg shortness of breath, COPDCurrent Smoker,  breath sounds clear to auscultation  Pulmonary exam normal       Cardiovascular hypertension, Pt. on medications and Pt. on home beta blockers Rhythm:Regular Rate:Normal     Neuro/Psych negative neurological ROS     GI/Hepatic Neg liver ROS, GERD-  Controlled,  Endo/Other  Hyperthyroidism (on tapazole) Morbid obesity  Renal/GU negative Renal ROS     Musculoskeletal   Abdominal (+) + obese,   Peds  Hematology   Anesthesia Other Findings   Reproductive/Obstetrics                           Anesthesia Physical Anesthesia Plan  ASA: II  Anesthesia Plan: General   Post-op Pain Management:    Induction: Intravenous  Airway Management Planned: LMA  Additional Equipment:   Intra-op Plan:   Post-operative Plan:   Informed Consent: I have reviewed the patients History and Physical, chart, labs and discussed the procedure including the risks, benefits and alternatives for the proposed anesthesia with the patient or authorized representative who has indicated his/her understanding and acceptance.   Dental advisory given  Plan Discussed with: CRNA and Surgeon  Anesthesia Plan Comments: (Plan routine monitors, GA- LMA OK, femoral nerve block for post op analgesia)        Anesthesia Quick Evaluation

## 2012-11-26 NOTE — Transfer of Care (Signed)
Immediate Anesthesia Transfer of Care Note  Patient: Lori Curtis  Procedure(s) Performed: Procedure(s) (LRB) with comments: TOTAL KNEE ARTHROPLASTY (Right)  Patient Location: PACU  Anesthesia Type:General  Level of Consciousness: awake and patient cooperative  Airway & Oxygen Therapy: Patient Spontanous Breathing and Patient connected to nasal cannula oxygen  Post-op Assessment: Report given to PACU RN, Post -op Vital signs reviewed and stable and Patient moving all extremities  Post vital signs: Reviewed and stable  Complications: No apparent anesthesia complications

## 2012-11-26 NOTE — Anesthesia Procedure Notes (Signed)
Anesthesia Regional Block:  Femoral nerve block  Pre-Anesthetic Checklist: ,, timeout performed, Correct Patient, Correct Site, Correct Laterality, Correct Procedure, Correct Position, site marked, Risks and benefits discussed,  Surgical consent,  Pre-op evaluation,  At surgeon's request and post-op pain management  Laterality: Right  Prep: chloraprep       Needles:  Injection technique: Single-shot  Needle Type: Stimulator Needle - 40     Needle Length: 4cm  Needle Gauge: 22 and 22 G    Additional Needles:  Procedures: nerve stimulator Femoral nerve block  Nerve Stimulator or Paresthesia:  Response: patella twitch, 0.45 mA, 0.1 ms,   Additional Responses:   Narrative:  Start time: 11/26/2012 9:49 AM End time: 11/26/2012 9:57 AM Injection made incrementally with aspirations every 5 mL.  Performed by: Personally  Anesthesiologist: Sandford Craze, MD  Additional Notes: Pt identified in Holding room.  Monitors applied. Working IV access confirmed. Sterile prep R groin.  #22ga PNS to patella twitch at 0.5mA threshold.  30cc 0.5% Bupivacaine with 1:200k epi injected incrementally after negative test dose.  Patient asymptomatic, VSS, no heme aspirated, tolerated well.   Sandford Craze, MD  Femoral nerve block

## 2012-11-26 NOTE — Preoperative (Signed)
Beta Blockers   Reason not to administer Beta Blockers:Toprol 0630 today

## 2012-11-26 NOTE — Progress Notes (Signed)
Orthopedic Tech Progress Note Patient Details:  Lori Curtis 03-25-1951 [id number]  CPM Right Knee CPM Right Knee: On Right Knee Flexion (Degrees): 60  Right Knee Extension (Degrees): 0  Additional Comments: applied overhead frame   Jennye Moccasin 11/26/2012, 1:46 PM

## 2012-11-26 NOTE — Anesthesia Postprocedure Evaluation (Signed)
  Anesthesia Post-op Note  Patient: Lori Curtis  Procedure(s) Performed: Procedure(s) (LRB) with comments: TOTAL KNEE ARTHROPLASTY (Right)  Patient Location: PACU  Anesthesia Type:GA combined with regional for post-op pain  Level of Consciousness: awake, alert , oriented and patient cooperative  Airway and Oxygen Therapy: Patient Spontanous Breathing and Patient connected to nasal cannula oxygen  Post-op Pain: mild  Post-op Assessment: Post-op Vital signs reviewed, Patient's Cardiovascular Status Stable, Respiratory Function Stable, Patent Airway, No signs of Nausea or vomiting and Pain level controlled  Post-op Vital Signs: Reviewed and stable  Complications: No apparent anesthesia complications

## 2012-11-27 ENCOUNTER — Encounter (HOSPITAL_COMMUNITY): Payer: Self-pay | Admitting: Orthopaedic Surgery

## 2012-11-27 LAB — CBC
HCT: 37.6 % (ref 36.0–46.0)
Hemoglobin: 12.1 g/dL (ref 12.0–15.0)
MCH: 28.3 pg (ref 26.0–34.0)
RBC: 4.28 MIL/uL (ref 3.87–5.11)

## 2012-11-27 LAB — BASIC METABOLIC PANEL
CO2: 25 mEq/L (ref 19–32)
Glucose, Bld: 116 mg/dL — ABNORMAL HIGH (ref 70–99)
Potassium: 3.8 mEq/L (ref 3.5–5.1)
Sodium: 134 mEq/L — ABNORMAL LOW (ref 135–145)

## 2012-11-27 NOTE — Evaluation (Signed)
Occupational Therapy Evaluation Patient Details Name: Lori Curtis MRN: [id number] DOB: 09-Apr-1951 Today's Date: 11/27/2012 Time: 4098-1191 OT Time Calculation (min): 30 min  OT Assessment / Plan / Recommendation Clinical Impression  61 yo female s/p Rt TKA that does not require skilled OT services acutely. Ot to sign off. Pt with recent lt knee surg and good recall    OT Assessment  Patient does not need any further OT services    Follow Up Recommendations  No OT follow up    Barriers to Discharge      Equipment Recommendations  None recommended by OT    Recommendations for Other Services    Frequency       Precautions / Restrictions Precautions Precautions: Knee Required Braces or Orthoses: Knee Immobilizer - Right Restrictions RLE Weight Bearing: Weight bearing as tolerated   Pertinent Vitals/Pain 9 out 10 pain Facial grimace with bed mobility    ADL  Eating/Feeding: Independent Where Assessed - Eating/Feeding: Edge of bed Grooming: Wash/dry hands;Wash/dry face;Teeth care;Modified independent Where Assessed - Grooming: Unsupported standing Upper Body Bathing: Chest;Right arm;Left arm;Abdomen;Modified independent Where Assessed - Upper Body Bathing: Unsupported sitting Lower Body Bathing: Supervision/safety Where Assessed - Lower Body Bathing: Unsupported standing Upper Body Dressing: Minimal assistance (due to iv line running through gown arm ) Where Assessed - Upper Body Dressing: Unsupported standing Lower Body Dressing: Supervision/safety Where Assessed - Lower Body Dressing: Unsupported standing Toilet Transfer: Supervision/safety Toilet Transfer Method: Sit to stand Toilet Transfer Equipment: Raised toilet seat with arms (or 3-in-1 over toilet) Toileting - Clothing Manipulation and Hygiene: Supervision/safety Where Assessed - Toileting Clothing Manipulation and Hygiene: Sit to stand from 3-in-1 or toilet Equipment Used: Gait belt;Rolling walker;Knee  Immobilizer Transfers/Ambulation Related to ADLs: pt completed bathroom transfer with min guard (A). Pt demonstrates a swing through gait pattern due to pain in rt le and preventing weight bearing on rt LE. with questioning cue pt returned to correct sequence ADL Comments: pt completed bed mobility with min (A) due to anxiety about sitting EOB and pain level currently. Pt ambulated to restroom and completed full adl at sink level sitting. Pt with previous knee surg 6 months ago on lt and demonstrates carry over from previous therapy. Pt returning to supine due to declining to sit in recliner. (CPM complete 2 hours on arrival pt removed from CPM)    OT Diagnosis:    OT Problem List:   OT Treatment Interventions:     OT Goals    Visit Information  Last OT Received On: 11/27/12 Assistance Needed: +1    Subjective Data  Subjective: "oh no i am not getting into that chair. But i want to take a shower"- pt response to OT introduction and arrival Patient Stated Goal: to get a shower   Prior Functioning     Home Living Lives With: Spouse Available Help at Discharge: Available 24 hours/day;Family Type of Home: House Home Access: Stairs to enter [employer] of Steps: 1 Entrance Stairs-Rails: None Home Layout: One level Bathroom Shower/Tub: Forensic scientist: Standard Bathroom Accessibility: Yes How Accessible: Accessible via walker Home Adaptive Equipment: Walker - rolling;Bedside commode/3-in-1;Tub transfer bench;Straight cane;Crutches Additional Comments: previous Lt knee surgery 6 months ago Prior Function Level of Independence: Independent Able to Take Stairs?: Reciprically Vocation: Full time employment Communication Communication: No difficulties Dominant Hand: Right         Vision/Perception     Cognition  Overall Cognitive Status: Appears within functional limits for tasks assessed/performed Arousal/Alertness:  Awake/alert Orientation  Level: Oriented X4 / Intact Behavior During Session: [facility] for tasks performed    Extremity/Trunk Assessment Right Upper Extremity Assessment RUE ROM/Strength/Tone: Within functional levels Left Upper Extremity Assessment LUE ROM/Strength/Tone: Within functional levels Trunk Assessment Trunk Assessment: Normal     Mobility Bed Mobility Supine to Sit: 4: Min assist Sitting - Scoot to Edge of Bed: 4: Min assist Sit to Supine: 4: Min assist Details for Bed Mobility Assistance: (A) with Rt LE  Transfers Sit to Stand: 5: Supervision Stand to Sit: 5: Supervision Details for Transfer Assistance: good demonstration of hand placement and sequence     Shoulder Instructions     Exercise     Balance     End of Session OT - End of Session Activity Tolerance: Patient tolerated treatment well Patient left: in bed;with call bell/phone within reach Nurse Communication: Mobility status;Precautions  GO     Lucile Shutters 11/27/2012, 1:27 PM Pager: [phone]

## 2012-11-27 NOTE — Progress Notes (Signed)
Physical Therapy Treatment Patient Details Name: Lori Curtis MRN: [id number] DOB: 10/03/1951 Today's Date: 11/27/2012 Time: 2956-2130 PT Time Calculation (min): 25 min  PT Assessment / Plan / Recommendation Comments on Treatment Session  Pt limited by pain this morning.  Will coordinate with nursing for pain medication prior to next session.  Pt received pain medication during session . PT performed manual joint mobilization with knee in extension and mild joint distraction for pain relief.  Pt      Follow Up Recommendations  Home health PT;Supervision/Assistance - 24 hour     Equipment Recommendations  None recommended by PT    Frequency 7X/week   Plan Discharge plan remains appropriate;Frequency remains appropriate    Precautions / Restrictions Precautions Precautions: Knee Precaution Booklet Issued: No Required Braces or Orthoses: Knee Immobilizer - Right Restrictions Weight Bearing Restrictions: Yes RLE Weight Bearing: Weight bearing as tolerated   Pertinent Vitals/Pain Pain in knee 10/10  See comments on session above for intervention.    Mobility  Bed Mobility Bed Mobility: Sit to Supine Sit to Supine: 4: Min assist;HOB flat Details for Bed Mobility Assistance: assist to lift R LE into bed pt performing majority of task assist mainly to minimize pain.  Transfers Transfers: Sit to Stand;Stand to Sit;Stand Pivot Transfers Sit to Stand: 5: Supervision;From chair/3-in-1;With upper extremity assist Stand to Sit: 5: Supervision;To bed Stand Pivot Transfers: 5: Supervision Details for Transfer Assistance: Cues for hand placement on armrest of chair and positioning of R LE to minimize pain.  Ambulation/Gait Ambulation/Gait Assistance: Not tested (comment) Ambulation/Gait Assistance Details: Pt in too much pain at this time.  Stairs: No Wheelchair Mobility Wheelchair Mobility: No    Exercises Total Joint Exercises Ankle Circles/Pumps: AROM;Both;10 reps;Supine Heel  Slides: AAROM;10 reps;Supine    PT Goals Acute Rehab PT Goals PT Goal Formulation: With patient Time For Goal Achievement: 11/28/12 Potential to Achieve Goals: Good Pt will go Supine/Side to Sit: with modified independence;with HOB 0 degrees PT Goal: Supine/Side to Sit - Progress: Progressing toward goal Pt will go Sit to Supine/Side: with modified independence;with HOB 0 degrees PT Goal: Sit to Supine/Side - Progress: Progressing toward goal Pt will go Sit to Stand: with supervision;with upper extremity assist PT Goal: Sit to Stand - Progress: Progressing toward goal Pt will go Stand to Sit: with supervision;with upper extremity assist PT Goal: Stand to Sit - Progress: Progressing toward goal Pt will Ambulate: 51 - 150 feet;with supervision;with least restrictive assistive device PT Goal: Ambulate - Progress: Not met Pt will Go Up / Down Stairs: 1-2 stairs;with min assist;with least restrictive assistive device PT Goal: Up/Down Stairs - Progress: Not met Pt will Perform Home Exercise Program: with supervision, verbal cues required/provided PT Goal: Perform Home Exercise Program - Progress: Progressing toward goal  Visit Information  Last PT Received On: 11/27/12    Subjective Data  Subjective: Pt c/o severe pain >10/10 Patient Stated Goal: go home Thurs (POD 2)   Cognition  Overall Cognitive Status: Appears within functional limits for tasks assessed/performed Arousal/Alertness: Awake/alert Orientation Level: Oriented X4 / Intact Behavior During Session: [facility] for tasks performed    Balance     End of Session PT - End of Session Equipment Utilized During Treatment: Gait belt Activity Tolerance: Patient limited by pain Patient left: in bed;in CPM;with call bell/phone within reach Nurse Communication: Mobility status;Other (comment) CPM Right Knee CPM Right Knee: On Right Knee Flexion (Degrees): 45  Right Knee Extension (Degrees): 10  Additional Comments: Pt instructed to  increase decrease extension to 0 degrees after 30 minutes.      GP     Valborg Friar 11/27/2012, 8:39 AM Theron Arista L. Dezmen Alcock DPT [phone]

## 2012-11-27 NOTE — Progress Notes (Signed)
CARE MANAGEMENT NOTE 11/27/2012  Patient:  WILEEN, DUNCANSON   Account Number:  [id number]  Date Initiated:  11/27/2012  Documentation initiated by:  Vance Peper  Subjective/Objective Assessment:   61 yr old female s/p right total knee arthroplasty.     Action/Plan:   CM spoke with patient concerning home health and DME needs at discharge. Choice offered. Patient has rolling walker. CPM and 3in1 to be delivered to her home by [employer].patient wants to speak with [employer], contacted Jodene Nam, liason.   Anticipated DC Date:  11/28/2012   Anticipated DC Plan:  HOME W HOME HEALTH SERVICES      DC Planning Services  CM consult      [facility] Choice  HOME HEALTH   Choice offered to / List presented to:  C-1 Patient        HH arranged  HH-2 PT      [facility] agency  [facility]   Status of service:  Completed, signed off Medicare Important Message given?   (If response is "NO", the following Medicare IM given date fields will be blank) Date Medicare IM given:   Date Additional Medicare IM given:    Discharge Disposition:  HOME W HOME HEALTH SERVICES  Per UR Regulation:    If discussed at Long Length of Stay Meetings, dates discussed:    Comments:

## 2012-11-27 NOTE — Progress Notes (Signed)
Physical Therapy Treatment Patient Details Name: Lori Curtis MRN: [id number] DOB: January 22, 1951 Today's Date: 11/27/2012 Time: 1400-1430 PT Time Calculation (min): 30 min  PT Assessment / Plan / Recommendation Comments on Treatment Session  Pt pain much improved.  Pt doing well with mobility.     Follow Up Recommendations  Home health PT;Supervision/Assistance - 24 hour     Does the patient have the potential to tolerate intense rehabilitation     Barriers to Discharge        Equipment Recommendations  None recommended by PT    Recommendations for Other Services    Frequency 7X/week   Plan Discharge plan remains appropriate;Frequency remains appropriate    Precautions / Restrictions Precautions Precautions: Knee Precaution Booklet Issued: No Required Braces or Orthoses: Knee Immobilizer - Right Knee Immobilizer - Right: Other (comment) (unspecified. ) Restrictions Weight Bearing Restrictions: Yes RLE Weight Bearing: Weight bearing as tolerated   Pertinent Vitals/Pain 5-6/10 pain in R knee.  Pt medicated prior to session.     Mobility  Bed Mobility Bed Mobility: Sit to Supine;Supine to Sit Supine to Sit: 4: Min assist Sitting - Scoot to Edge of Bed: 4: Min assist Sit to Supine: 4: Min assist Details for Bed Mobility Assistance: (A) with Rt LE  Transfers Transfers: Sit to Stand;Stand to Sit Sit to Stand: 5: Supervision Stand to Sit: 5: Supervision Stand Pivot Transfers: 5: Supervision Details for Transfer Assistance: pt demonstrated safe technique.  Ambulation/Gait Ambulation/Gait Assistance: 5: Supervision Ambulation Distance (Feet): 80 Feet Assistive device: Rolling walker Ambulation/Gait Assistance Details: cues for gait sequencing.  Gait Pattern: Step-to pattern;Decreased stride length;Antalgic Stairs: No Wheelchair Mobility Wheelchair Mobility: No    Exercises     PT Diagnosis:    PT Problem List:   PT Treatment Interventions:     PT Goals Acute  Rehab PT Goals PT Goal Formulation: With patient Time For Goal Achievement: 11/28/12 Potential to Achieve Goals: Good Pt will go Supine/Side to Sit: with modified independence;with HOB 0 degrees PT Goal: Supine/Side to Sit - Progress: Progressing toward goal Pt will go Sit to Supine/Side: with modified independence;with HOB 0 degrees PT Goal: Sit to Supine/Side - Progress: Progressing toward goal Pt will go Sit to Stand: with supervision;with upper extremity assist PT Goal: Sit to Stand - Progress: Met Pt will go Stand to Sit: with supervision;with upper extremity assist PT Goal: Stand to Sit - Progress: Met Pt will Ambulate: 51 - 150 feet;with supervision;with least restrictive assistive device PT Goal: Ambulate - Progress: Partly met Pt will Go Up / Down Stairs: 1-2 stairs;with min assist;with least restrictive assistive device Pt will Perform Home Exercise Program: with supervision, verbal cues required/provided  Visit Information  Last PT Received On: 11/27/12 Assistance Needed: +1    Subjective Data  Subjective: pain much better Patient Stated Goal: go home Thurs (POD 2)   Cognition  Overall Cognitive Status: Appears within functional limits for tasks assessed/performed Arousal/Alertness: Awake/alert Orientation Level: Appears intact for tasks assessed Behavior During Session: [facility] for tasks performed    Balance     End of Session PT - End of Session Equipment Utilized During Treatment: Gait belt Activity Tolerance: Patient tolerated treatment well Patient left: in bed;with call bell/phone within reach Nurse Communication: Mobility status;Other (comment)   GP     Dyllon Henken 11/27/2012, 3:48 PM Rashi Giuliani L. Muadh Creasy DPT [phone]

## 2012-11-27 NOTE — Progress Notes (Signed)
Utilization review completed. Hakop Humbarger, RN, BSN. 

## 2012-11-27 NOTE — Progress Notes (Signed)
Subjective: 1 Day Post-Op Procedure(s) (LRB): TOTAL KNEE ARTHROPLASTY (Right)  Activity level:  wbat Diet tolerance:  ok Voiding:  ok Patient reports pain as 6 on 0-10 scale.    Objective: Vital signs in last 24 hours: Temp:  [97.6 F (36.4 C)-98.9 F (37.2 C)] 98.9 F (37.2 C) (12/11 0601) Pulse Rate:  [52-83] 73  (12/11 0601) Resp:  [10-24] 16  (12/11 0601) BP: (123-156)/(59-86) 123/59 mmHg (12/11 0601) SpO2:  [94 %-100 %] 95 % (12/11 0601)  Labs:  Basename 11/27/12 0619  HGB 12.1    Basename 11/27/12 0619  WBC 8.2  RBC 4.28  HCT 37.6  PLT 107*    Basename 11/27/12 0619  NA 134*  K 3.8  CL 100  CO2 25  BUN 11  CREATININE 0.70  GLUCOSE 116*  CALCIUM 8.9   No results found for this basename: LABPT:2,INR:2 in the last 72 hours  Physical Exam:  Neurologically intact ABD soft Neurovascular intact Sensation intact distally Intact pulses distally Dorsiflexion/Plantar flexion intact Incision: dressing C/D/I No cellulitis present Compartment soft  Assessment/Plan:  1 Day Post-Op Procedure(s) (LRB): TOTAL KNEE ARTHROPLASTY (Right) Advance diet Up with therapy D/C IV fluids Plan for discharge tomorrow ASA 325 bid x 2 weeks    Ayris Carano R 11/27/2012, 8:15 AM

## 2012-11-28 ENCOUNTER — Encounter (HOSPITAL_COMMUNITY): Payer: Self-pay | Admitting: General Practice

## 2012-11-28 DIAGNOSIS — M79609 Pain in unspecified limb: Secondary | ICD-10-CM

## 2012-11-28 LAB — CBC
HCT: 34.9 % — ABNORMAL LOW (ref 36.0–46.0)
Hemoglobin: 11.8 g/dL — ABNORMAL LOW (ref 12.0–15.0)
MCH: 29.7 pg (ref 26.0–34.0)
MCV: 87.9 fL (ref 78.0–100.0)
RBC: 3.97 MIL/uL (ref 3.87–5.11)

## 2012-11-28 MED ORDER — ASPIRIN 325 MG PO TBEC: 325.0000 mg | DELAYED_RELEASE_TABLET | Freq: Two times a day (BID) | ORAL | Status: DC

## 2012-11-28 MED ORDER — METAXALONE 800 MG PO TABS: 800.0000 mg | ORAL_TABLET | Freq: Two times a day (BID) | ORAL | Status: AC | PRN

## 2012-11-28 MED ORDER — OXYCODONE HCL 5 MG PO TABS: ORAL_TABLET | ORAL | Status: DC

## 2012-11-28 NOTE — Discharge Summary (Signed)
Patient ID: Lori Curtis MRN: [id number] DOB/AGE: 06/01/1951 61 y.o.  Admit date: 11/26/2012 Discharge date: 11/28/2012  Admission Diagnoses:  Principal Problem:  *Right knee DJD   Discharge Diagnoses:  Same  Past Medical History  Diagnosis Date  . Hypertension   . Hyperthyroidism     graves disease  . Shortness of breath   . GERD (gastroesophageal reflux disease)   . Arthritis   . Graves disease     Surgeries: Procedure(s): TOTAL KNEE ARTHROPLASTY on 11/26/2012   Consultants:    Discharged Condition: Improved  Hospital Course: Lori Curtis is an 61 y.o. female who was admitted 11/26/2012 for operative treatment ofRight knee DJD. Patient has severe unremitting pain that affects sleep, daily activities, and work/hobbies. After pre-op clearance the patient was taken to the operating room on 11/26/2012 and underwent  Procedure(s): TOTAL KNEE ARTHROPLASTY.    Patient was given perioperative antibiotics: Anti-infectives     Start     Dose/Rate Route Frequency Ordered Stop   11/26/12 2330   vancomycin (VANCOCIN) IVPB 1000 mg/200 mL premix        1,000 mg 200 mL/hr over 60 Minutes Intravenous Every 12 hours 11/26/12 1455 11/26/12 2343   11/26/12 1150   tobramycin (NEBCIN) powder  Status:  Discontinued          As needed 11/26/12 1150 11/26/12 1308   11/25/12 1455   vancomycin (VANCOCIN) IVPB 1000 mg/200 mL premix        1,000 mg 200 mL/hr over 60 Minutes Intravenous 60 min pre-op 11/25/12 1455 11/26/12 1120           Patient was given sequential compression devices, early ambulation, and chemoprophylaxis to prevent DVT.  Patient benefited maximally from hospital stay and there were no complications.    Recent vital signs: Patient Vitals for the past 24 hrs:  BP Temp Pulse Resp SpO2  11/28/12 0700 134/63 mmHg 98.8 F (37.1 C) 62  18  90 %  11/28/12 0400 - - - 18  -  11/28/12 0000 - - - 16  -  2012/12/09 2211 136/60 mmHg 99.6 F (37.6 C) 78  18  92 %   December 09, 2012 2000 - - - 16  -  12-09-12 1400 132/51 mmHg 98.6 F (37 C) 65  16  95 %     Recent laboratory studies:  [facility] 11/28/12 0615 12-09-2012 0619  WBC 8.7 8.2  HGB 11.8* 12.1  HCT 34.9* 37.6  PLT 89* 107*  NA -- 134*  K -- 3.8  CL -- 100  CO2 -- 25  BUN -- 11  CREATININE -- 0.70  GLUCOSE -- 116*  INR -- --  CALCIUM -- 8.9     Discharge Medications:     Medication List     As of 11/28/2012 10:30 AM    STOP taking these medications         celecoxib 200 MG capsule   Commonly known as: CELEBREX      TAKE these medications         amLODipine 10 MG tablet   Commonly known as: NORVASC   Take 10 mg by mouth daily.      aspirin 325 MG EC tablet   Take 1 tablet (325 mg total) by mouth 2 (two) times daily.      CENTRUM PO   Take 1 tablet by mouth daily.      metaxalone 800 MG tablet   Commonly known as: SKELAXIN   Take 1 tablet (800 mg  total) by mouth 2 (two) times daily as needed.      methimazole 5 MG tablet   Commonly known as: TAPAZOLE   Take 2.5-5 mg by mouth 2 (two) times daily. 5 mg every morning and 2.5 mg at bedtime      metoprolol succinate 100 MG 24 hr tablet   Commonly known as: TOPROL-XL   Take 100 mg by mouth daily. Take with or immediately following a meal.      oxyCODONE 5 MG immediate release tablet   Commonly known as: Oxy IR/ROXICODONE   1-3 pills every 3-4 hrs as needed for pain      simvastatin 40 MG tablet   Commonly known as: ZOCOR   Take 40 mg by mouth every evening.        Diagnostic Studies: r  Leg doppler done prior to d/c  Disposition: 01-Home or Self Care      Discharge Orders    Future Orders Please Complete By Expires   Diet - low sodium heart healthy      Call MD / Call 911      Comments:   If you experience chest pain or shortness of breath, CALL 911 and be transported to the hospital emergency room.  If you develope a fever above 101 F, pus (white drainage) or increased drainage or redness at the wound, or  calf pain, call your surgeon's office.   Constipation Prevention      Comments:   Drink plenty of fluids.  Prune juice may be helpful.  You may use a stool softener, such as Colace (over the counter) 100 mg twice a day.  Use MiraLax (over the counter) for constipation as needed.   Increase activity slowly as tolerated         Follow-up Information    Follow up with Velna Ochs, MD. Call in 2 weeks.   Contact information:   [address] [place] [address] [phone]           Signed: Prince Rome 11/28/2012, 10:30 AM

## 2012-11-28 NOTE — Progress Notes (Signed)
Pt discharged to home accompanied by husband. Discharge instructions and rx given and explained and pt stated understanding. Pts IV was removed. Pt left unit in a stable condition via wheelchair. 

## 2012-11-28 NOTE — Progress Notes (Signed)
VASCULAR LAB PRELIMINARY  PRELIMINARY  PRELIMINARY  PRELIMINARY  Right lower extremity venous duplex completed.    Preliminary report:  Right:  No evidence of DVT, superficial thrombosis, or Baker's cyst.  Andray Assefa, RVS 11/28/2012, 2:19 PM

## 2012-11-28 NOTE — Progress Notes (Signed)
Physical Therapy Treatment Patient Details Name: Lori Curtis MRN: [id number] DOB: Sep 03, 1951 Today's Date: 11/28/2012 Time: 6578-4696 PT Time Calculation (min): 26 min  PT Assessment / Plan / Recommendation Comments on Treatment Session  Spoke with MD about calf pain.  MD ordered venous doppler study to r/o DVT.  Willl continue to progress pt with plan to d/c home today.     Follow Up Recommendations  Home health PT;Supervision/Assistance - 24 hour     Does the patient have the potential to tolerate intense rehabilitation     Barriers to Discharge        Equipment Recommendations  None recommended by PT    Recommendations for Other Services    Frequency 7X/week   Plan Discharge plan remains appropriate;Frequency remains appropriate    Precautions / Restrictions Precautions Precautions: Knee Precaution Booklet Issued: No Required Braces or Orthoses: Knee Immobilizer - Right Knee Immobilizer - Right: Other (comment) Restrictions Weight Bearing Restrictions: Yes RLE Weight Bearing: Weight bearing as tolerated   Pertinent Vitals/Pain 10/10 pan in calf and proximal anterior tibia.      Mobility  Bed Mobility Bed Mobility: Sit to Supine;Supine to Sit Supine to Sit: 5: Supervision Sitting - Scoot to Edge of Bed: 5: Supervision Sit to Supine: 5: Supervision Transfers Transfers: Sit to Stand;Stand to Sit Sit to Stand: 5: Supervision;From bed;From chair/3-in-1;With upper extremity assist Stand to Sit: 5: Supervision Stand Pivot Transfers: 5: Supervision Ambulation/Gait Ambulation/Gait Assistance: 5: Supervision Ambulation Distance (Feet): 50 Feet Assistive device: Rolling walker Ambulation/Gait Assistance Details: Pt limited by pain in calf/knee Gait Pattern: Step-to pattern;Decreased stride length;Antalgic Stairs: No    Exercises     PT Diagnosis:    PT Problem List:   PT Treatment Interventions:     PT Goals Acute Rehab PT Goals PT Goal Formulation: With  patient Time For Goal Achievement: 11/28/12 Potential to Achieve Goals: Good Pt will go Supine/Side to Sit: with modified independence;with HOB 0 degrees PT Goal: Supine/Side to Sit - Progress: Met Pt will go Sit to Supine/Side: with modified independence;with HOB 0 degrees PT Goal: Sit to Supine/Side - Progress: Progressing toward goal Pt will go Sit to Stand: with supervision;with upper extremity assist PT Goal: Sit to Stand - Progress: Met Pt will go Stand to Sit: with supervision;with upper extremity assist PT Goal: Stand to Sit - Progress: Met Pt will Ambulate: 51 - 150 feet;with supervision;with least restrictive assistive device PT Goal: Ambulate - Progress: Progressing toward goal Pt will Perform Home Exercise Program: with supervision, verbal cues required/provided  Visit Information  Last PT Received On: 11/28/12    Subjective Data  Subjective: Still a lot of pain in my calf Patient Stated Goal: go home Thurs (POD 2)   Cognition  Overall Cognitive Status: Appears within functional limits for tasks assessed/performed Arousal/Alertness: Awake/alert Orientation Level: Appears intact for tasks assessed Behavior During Session: [facility] for tasks performed    Balance     End of Session PT - End of Session Equipment Utilized During Treatment: Gait belt Activity Tolerance: Patient limited by pain Patient left: in chair;with call bell/phone within reach Nurse Communication: Mobility status;Patient requests pain meds   GP     Contina Strain 11/28/2012, 2:03 PM Minas Bonser L. Fizza Scales DPT [phone]

## 2012-11-28 NOTE — Progress Notes (Signed)
Subjective: 2 Days Post-Op Procedure(s) (LRB): TOTAL KNEE ARTHROPLASTY (Right) C/O of right calf pain-will get doppler prior to d/c Activity level:  oob wbat Diet tolerance:  ok Voiding:  ok Patient reports pain as 5 on 0-10 scale.    Objective: Vital signs in last 24 hours: Temp:  [98.6 F (37 C)-99.6 F (37.6 C)] 98.8 F (37.1 C) (12/12 0700) Pulse Rate:  [62-78] 62  (12/12 0700) Resp:  [16-18] 18  (12/12 0700) BP: (132-136)/(51-63) 134/63 mmHg (12/12 0700) SpO2:  [90 %-95 %] 90 % (12/12 0700)  Labs:  [facility] 11/28/12 0615 11/27/12 0619  HGB 11.8* 12.1    Basename 11/28/12 0615 11/27/12 0619  WBC 8.7 8.2  RBC 3.97 4.28  HCT 34.9* 37.6  PLT 89* 107*    Basename 11/27/12 0619  NA 134*  K 3.8  CL 100  CO2 25  BUN 11  CREATININE 0.70  GLUCOSE 116*  CALCIUM 8.9   No results found for this basename: LABPT:2,INR:2 in the last 72 hours  Physical Exam:  Neurologically intact ABD soft Neurovascular intact Sensation intact distally Intact pulses distally Dorsiflexion/Plantar flexion intact No cellulitis present Compartment soft -she has some mild Curtis calf pain  Assessment/Plan:  2 Days Post-Op Procedure(s) (LRB): TOTAL KNEE ARTHROPLASTY (Right) Advance diet Up with therapy Discharge home with home health after doppler Asa 325 bid  oxy ir prn pain    Lori Curtis 11/28/2012, 10:16 AM

## 2012-11-28 NOTE — Progress Notes (Signed)
Physical Therapy Treatment Patient Details Name: Lori Curtis MRN: [id number] DOB: 06-14-1951 Today's Date: 11/28/2012 Time: 4098-1191 PT Time Calculation (min): 30 min  PT Assessment / Plan / Recommendation Comments on Treatment Session  reviewed HEP with pt.  Pt c/o ace wrap on knee cutting into her and cutting off circulation. Removed ace wrap and applied mepilex dressing after speaking with RN.  Reviewed stairs with pt and spouse. .    Follow Up Recommendations  Home health PT;Supervision/Assistance - 24 hour     Does the patient have the potential to tolerate intense rehabilitation     Barriers to Discharge        Equipment Recommendations  None recommended by PT    Recommendations for Other Services    Frequency 7X/week   Plan Discharge plan remains appropriate;Frequency remains appropriate    Precautions / Restrictions Precautions Precautions: Knee Precaution Booklet Issued: No Required Braces or Orthoses: Knee Immobilizer - Right Knee Immobilizer - Right: Other (comment) Restrictions Weight Bearing Restrictions: Yes RLE Weight Bearing: Weight bearing as tolerated   Pertinent Vitals/Pain 5/10 pain in Knee.  RN notified    Mobility  Bed Mobility Bed Mobility: Sit to Supine;Supine to Sit Supine to Sit: 6: Modified independent (Device/Increase time) Sitting - Scoot to Edge of Bed: 6: Modified independent (Device/Increase time) Transfers Transfers: Sit to Stand;Stand to [employer] Transfers Sit to Stand: 6: Modified independent (Device/Increase time) Stand to Sit: 6: Modified independent (Device/Increase time) Stand Pivot Transfers: 6: Modified independent (Device/Increase time) Ambulation/Gait Ambulation/Gait Assistance: Not tested (comment) Stairs: Yes Stairs Assistance: 4: Min assist Stairs Assistance Details (indicate cue type and reason): cues for sequencing Stair Management Technique: With walker;Backwards Number of Stairs: 1     Exercises      PT Diagnosis:    PT Problem List:   PT Treatment Interventions:     PT Goals Acute Rehab PT Goals PT Goal Formulation: With patient Time For Goal Achievement: 11/28/12 Potential to Achieve Goals: Good Pt will go Supine/Side to Sit: with modified independence;with HOB 0 degrees PT Goal: Supine/Side to Sit - Progress: Met Pt will go Sit to Supine/Side: with modified independence;with HOB 0 degrees PT Goal: Sit to Supine/Side - Progress: Met Pt will go Sit to Stand: with supervision;with upper extremity assist PT Goal: Sit to Stand - Progress: Met Pt will go Stand to Sit: with supervision;with upper extremity assist PT Goal: Stand to Sit - Progress: Met Pt will Ambulate: 51 - 150 feet;with supervision;with least restrictive assistive device PT Goal: Ambulate - Progress: Partly met Pt will Go Up / Down Stairs: 1-2 stairs;with min assist;with least restrictive assistive device PT Goal: Up/Down Stairs - Progress: Met Pt will Perform Home Exercise Program: with supervision, verbal cues required/provided PT Goal: Perform Home Exercise Program - Progress: Met  Visit Information  Last PT Received On: 11/28/12    Subjective Data  Subjective: My pain is much better. Doppler was negative for blood clot.  Patient Stated Goal: go home Thurs (POD 2)   Cognition  Overall Cognitive Status: Appears within functional limits for tasks assessed/performed Arousal/Alertness: Awake/alert Orientation Level: Appears intact for tasks assessed Behavior During Session: [facility] for tasks performed    Balance     End of Session PT - End of Session Equipment Utilized During Treatment: Gait belt Activity Tolerance: Patient tolerated treatment well Patient left: in chair;with call bell/phone within reach Nurse Communication: Mobility status;Patient requests pain meds   GP     Cortney Mckinney 11/28/2012, 6:21 PM Estalene Bergey L.  Soniya Ashraf DPT [phone]

## 2013-08-25 ENCOUNTER — Other Ambulatory Visit (HOSPITAL_COMMUNITY): Payer: Self-pay | Admitting: Orthopaedic Surgery

## 2013-08-25 DIAGNOSIS — M25561 Pain in right knee: Secondary | ICD-10-CM

## 2013-08-28 ENCOUNTER — Encounter (HOSPITAL_COMMUNITY): Payer: BC Managed Care – PPO

## 2013-09-03 ENCOUNTER — Encounter (HOSPITAL_COMMUNITY)
Admission: RE | Admit: 2013-09-03 | Discharge: 2013-09-03 | Disposition: A | Discharge status: Home / Self Care | Payer: BC Managed Care – PPO | Source: Ambulatory Visit | Attending: Orthopaedic Surgery | Admitting: Orthopaedic Surgery

## 2013-09-03 DIAGNOSIS — Z96659 Presence of unspecified artificial knee joint: Secondary | ICD-10-CM | POA: Insufficient documentation

## 2013-09-03 DIAGNOSIS — M25569 Pain in unspecified knee: Secondary | ICD-10-CM | POA: Insufficient documentation

## 2013-09-03 DIAGNOSIS — M25561 Pain in right knee: Secondary | ICD-10-CM

## 2013-09-03 MED ORDER — TECHNETIUM TC 99M MEDRONATE IV KIT
25.0000 | PACK | Freq: Once | INTRAVENOUS | Status: AC | PRN
  Administered 2013-09-03: 25 via INTRAVENOUS

## 2013-10-22 NOTE — Progress Notes (Signed)
Dr. Harriet Pho - Please enter preop orders in Epic for Lori Curtis - she is coming to [facility] on 11/12 for her preop appointment / labs.    Thanks.

## 2013-10-28 ENCOUNTER — Other Ambulatory Visit (HOSPITAL_COMMUNITY): Payer: Self-pay | Admitting: *Deleted

## 2013-10-29 ENCOUNTER — Encounter (HOSPITAL_COMMUNITY)
Admission: RE | Admit: 2013-10-29 | Discharge: 2013-10-29 | Disposition: A | Discharge status: Home / Self Care | Payer: BC Managed Care – PPO | Source: Ambulatory Visit | Attending: Podiatry | Admitting: Podiatry

## 2013-10-29 ENCOUNTER — Encounter (HOSPITAL_COMMUNITY): Payer: Self-pay

## 2013-10-29 ENCOUNTER — Encounter (HOSPITAL_COMMUNITY): Payer: Self-pay | Admitting: Pharmacy Technician

## 2013-10-29 ENCOUNTER — Ambulatory Visit (HOSPITAL_COMMUNITY)
Admission: RE | Admit: 2013-10-29 | Discharge: 2013-10-29 | Disposition: A | Discharge status: Home / Self Care | Payer: BC Managed Care – PPO | Source: Ambulatory Visit | Attending: Podiatry | Admitting: Podiatry

## 2013-10-29 DIAGNOSIS — M2020 Hallux rigidus, unspecified foot: Secondary | ICD-10-CM | POA: Insufficient documentation

## 2013-10-29 DIAGNOSIS — Z01812 Encounter for preprocedural laboratory examination: Secondary | ICD-10-CM | POA: Insufficient documentation

## 2013-10-29 DIAGNOSIS — Z01810 Encounter for preprocedural cardiovascular examination: Secondary | ICD-10-CM | POA: Insufficient documentation

## 2013-10-29 DIAGNOSIS — I10 Essential (primary) hypertension: Secondary | ICD-10-CM | POA: Insufficient documentation

## 2013-10-29 LAB — CBC
HCT: 46.5 % — ABNORMAL HIGH (ref 36.0–46.0)
Hemoglobin: 15.8 g/dL — ABNORMAL HIGH (ref 12.0–15.0)
MCHC: 34 g/dL (ref 30.0–36.0)
RBC: 5.28 MIL/uL — ABNORMAL HIGH (ref 3.87–5.11)
WBC: 5.5 10*3/uL (ref 4.0–10.5)

## 2013-10-29 LAB — BASIC METABOLIC PANEL
Chloride: 102 mEq/L (ref 96–112)
GFR calc Af Amer: >90 mL/min (ref >90)
GFR calc non Af Amer: >90 mL/min (ref >90)
Potassium: 4.5 mEq/L (ref 3.5–5.1)
Sodium: 137 mEq/L (ref 135–145)

## 2013-10-29 LAB — SURGICAL PCR SCREEN
MRSA, PCR: NEGATIVE
Staphylococcus aureus: NEGATIVE

## 2013-10-29 NOTE — Patient Instructions (Addendum)
Lori Curtis  10/29/2013                           YOUR PROCEDURE IS SCHEDULED ON:  11/06/13               PLEASE REPORT TO SHORT STAY CENTER AT :  5:30 AM               CALL THIS NUMBER IF ANY PROBLEMS THE DAY OF SURGERY :               [phone]                      REMEMBER:   Do not eat food or drink liquids AFTER MIDNIGHT   Take these medicines the morning of surgery with A SIP OF WATER: AMLODIPINE / LIPITOR / METHIMAZOLE / METOPROLOL   Do not wear jewelry, make-up   Do not wear lotions, powders, or perfumes.   Do not shave legs or underarms 12 hrs. before surgery (men may shave face)  Do not bring valuables to the hospital.  Contacts, dentures or bridgework may not be worn into surgery.  Leave suitcase in the car. After surgery it may be brought to your room.  For patients admitted to the hospital more than one night, checkout time is 11:00                          The day of discharge.   Patients discharged the day of surgery will not be allowed to drive home                             If going home same day of surgery, must have someone stay with you first                           24 hrs at home and arrange for some one to drive you home from hospital.    Special Instructions:   Please read over the following fact sheets that you were given:               1. MRSA  INFORMATION                      2. [place] PREPARING FOR SURGERY SHEET                                                X_____________________________________________________________________        Failure to follow these instructions may result in cancellation of your surgery

## 2013-10-31 NOTE — Progress Notes (Signed)
NEED ORDERS PLEASE - SURG 11/03/13 - THANK YOU

## 2013-11-03 ENCOUNTER — Encounter (HOSPITAL_BASED_OUTPATIENT_CLINIC_OR_DEPARTMENT_OTHER): Payer: Self-pay | Admitting: *Deleted

## 2013-11-04 ENCOUNTER — Encounter (HOSPITAL_BASED_OUTPATIENT_CLINIC_OR_DEPARTMENT_OTHER): Payer: Self-pay | Admitting: *Deleted

## 2013-11-04 NOTE — Progress Notes (Signed)
NPO AFTER MN. ARRIVE AT 0630. NEEDS EKG. CURRENT LAB RESULTS IN EPIC AND CHART. WILL TAKE TRAPAZOLE, METOPROLOL, NORVASC, AND LIPITOR AM DOS W/ SIPS OF WATER.

## 2013-11-05 SURGERY — ARTHROPLASTY, JOINT, TOE, USING FLEXIBLE SILICONE ELASTOMER IMPLANT

## 2013-11-06 ENCOUNTER — Encounter (HOSPITAL_BASED_OUTPATIENT_CLINIC_OR_DEPARTMENT_OTHER)
Admission: RE | Disposition: A | Discharge status: Home / Self Care | Payer: Self-pay | Source: Ambulatory Visit | Attending: Podiatry

## 2013-11-06 ENCOUNTER — Ambulatory Visit (HOSPITAL_BASED_OUTPATIENT_CLINIC_OR_DEPARTMENT_OTHER): Payer: BC Managed Care – PPO | Admitting: Anesthesiology

## 2013-11-06 ENCOUNTER — Encounter (HOSPITAL_COMMUNITY): Admission: RE | Payer: Self-pay | Source: Ambulatory Visit

## 2013-11-06 ENCOUNTER — Ambulatory Visit (HOSPITAL_BASED_OUTPATIENT_CLINIC_OR_DEPARTMENT_OTHER)
Admission: RE | Admit: 2013-11-06 | Discharge: 2013-11-06 | Disposition: A | Discharge status: Home / Self Care | Payer: BC Managed Care – PPO | Source: Ambulatory Visit | Attending: Podiatry | Admitting: Podiatry

## 2013-11-06 ENCOUNTER — Ambulatory Visit (HOSPITAL_COMMUNITY): Admission: RE | Admit: 2013-11-06 | Payer: BC Managed Care – PPO | Source: Ambulatory Visit | Admitting: Podiatry

## 2013-11-06 ENCOUNTER — Encounter (HOSPITAL_BASED_OUTPATIENT_CLINIC_OR_DEPARTMENT_OTHER): Payer: BC Managed Care – PPO | Admitting: Anesthesiology

## 2013-11-06 ENCOUNTER — Ambulatory Visit: Admit: 2013-11-06 | Payer: BC Managed Care – PPO | Admitting: Podiatry

## 2013-11-06 ENCOUNTER — Encounter (HOSPITAL_BASED_OUTPATIENT_CLINIC_OR_DEPARTMENT_OTHER): Payer: Self-pay | Admitting: *Deleted

## 2013-11-06 DIAGNOSIS — I10 Essential (primary) hypertension: Secondary | ICD-10-CM | POA: Insufficient documentation

## 2013-11-06 DIAGNOSIS — M2020 Hallux rigidus, unspecified foot: Secondary | ICD-10-CM | POA: Insufficient documentation

## 2013-11-06 DIAGNOSIS — Z79899 Other long term (current) drug therapy: Secondary | ICD-10-CM | POA: Insufficient documentation

## 2013-11-06 DIAGNOSIS — F17200 Nicotine dependence, unspecified, uncomplicated: Secondary | ICD-10-CM | POA: Insufficient documentation

## 2013-11-06 DIAGNOSIS — E0590 Thyrotoxicosis, unspecified without thyrotoxic crisis or storm: Secondary | ICD-10-CM | POA: Insufficient documentation

## 2013-11-06 HISTORY — DX: Chondrocostal junction syndrome (tietze): M94.0

## 2013-11-06 HISTORY — DX: Hyperlipidemia, unspecified: E78.5

## 2013-11-06 HISTORY — PX: IMPLANT OF A SILASTIC METATARSAL PHALANGE JOINT: SHX6452

## 2013-11-06 HISTORY — DX: Unspecified osteoarthritis, unspecified site: M19.90

## 2013-11-06 HISTORY — DX: Atrioventricular block, first degree: I44.0

## 2013-11-06 HISTORY — DX: Presence of spectacles and contact lenses: Z97.3

## 2013-11-06 SURGERY — ARTHROPLASTY, JOINT, TOE, USING FLEXIBLE SILICONE ELASTOMER IMPLANT
Anesthesia: General | Site: Toe | Laterality: Left

## 2013-11-06 SURGERY — ARTHROPLASTY, JOINT, TOE, USING FLEXIBLE SILICONE ELASTOMER IMPLANT
Anesthesia: General | Site: Foot | Laterality: Left | Wound class: Clean

## 2013-11-06 SURGERY — ARTHROPLASTY, JOINT, TOE, USING FLEXIBLE SILICONE ELASTOMER IMPLANT
Anesthesia: Choice | Laterality: Left

## 2013-11-06 MED ORDER — CLINDAMYCIN PHOSPHATE 600 MG/50ML IV SOLN
INTRAVENOUS | Status: DC | PRN
  Administered 2013-11-06: 600 mg via INTRAVENOUS

## 2013-11-06 MED ORDER — ONDANSETRON HCL 4 MG/2ML IJ SOLN
INTRAMUSCULAR | Status: DC | PRN
  Administered 2013-11-06: 4 mg via INTRAVENOUS

## 2013-11-06 MED ORDER — PROPOFOL 10 MG/ML IV BOLUS
INTRAVENOUS | Status: DC | PRN
  Administered 2013-11-06: 200 mg via INTRAVENOUS

## 2013-11-06 MED ORDER — LACTATED RINGERS IV SOLN
INTRAVENOUS | Status: DC
  Administered 2013-11-06 (×2): via INTRAVENOUS
  Filled 2013-11-06: qty 1000

## 2013-11-06 MED ORDER — EPHEDRINE SULFATE 50 MG/ML IJ SOLN
INTRAMUSCULAR | Status: DC | PRN
  Administered 2013-11-06 (×2): 10 mg via INTRAVENOUS

## 2013-11-06 MED ORDER — FENTANYL CITRATE 0.05 MG/ML IJ SOLN
INTRAMUSCULAR | Status: DC | PRN
  Administered 2013-11-06: 50 ug via INTRAVENOUS
  Administered 2013-11-06 (×4): 25 ug via INTRAVENOUS

## 2013-11-06 MED ORDER — PROMETHAZINE HCL 25 MG/ML IJ SOLN
6.2500 mg | INTRAMUSCULAR | Status: DC | PRN
  Filled 2013-11-06: qty 1

## 2013-11-06 MED ORDER — PROMETHAZINE HCL 12.5 MG PO TABS
12.5000 mg | ORAL_TABLET | ORAL | Status: DC | PRN
  Filled 2013-11-06: qty 1

## 2013-11-06 MED ORDER — OXYCODONE-ACETAMINOPHEN 5-325 MG PO TABS
ORAL_TABLET | ORAL | Status: AC
  Filled 2013-11-06: qty 1

## 2013-11-06 MED ORDER — FENTANYL CITRATE 0.05 MG/ML IJ SOLN
INTRAMUSCULAR | Status: AC
  Filled 2013-11-06: qty 2

## 2013-11-06 MED ORDER — OXYCODONE-ACETAMINOPHEN 5-325 MG PO TABS
1.0000 | ORAL_TABLET | ORAL | Status: DC | PRN
  Administered 2013-11-06: 1 via ORAL
  Filled 2013-11-06: qty 2

## 2013-11-06 MED ORDER — BUPIVACAINE HCL 0.5 % IJ SOLN
INTRAMUSCULAR | Status: DC | PRN
  Administered 2013-11-06: 10 mL via INTRA_ARTICULAR

## 2013-11-06 MED ORDER — LIDOCAINE HCL 2 % IJ SOLN
INTRAMUSCULAR | Status: DC | PRN
  Administered 2013-11-06: 10 mL

## 2013-11-06 MED ORDER — FENTANYL CITRATE 0.05 MG/ML IJ SOLN
25.0000 ug | INTRAMUSCULAR | Status: DC | PRN
  Administered 2013-11-06 (×3): 25 ug via INTRAVENOUS
  Filled 2013-11-06: qty 1

## 2013-11-06 MED ORDER — LIDOCAINE HCL (CARDIAC) 20 MG/ML IV SOLN
INTRAVENOUS | Status: DC | PRN
  Administered 2013-11-06: 60 mg via INTRAVENOUS

## 2013-11-06 MED ORDER — DEXAMETHASONE SODIUM PHOSPHATE 4 MG/ML IJ SOLN
INTRAMUSCULAR | Status: DC | PRN
  Administered 2013-11-06: 8 mg via INTRAVENOUS

## 2013-11-06 SURGICAL SUPPLY — 69 items
BANDAGE CO FLEX L/F 2IN X 5YD (GAUZE/BANDAGES/DRESSINGS) IMPLANT
BANDAGE COBAN STERILE 2 (GAUZE/BANDAGES/DRESSINGS) ×2 IMPLANT
BANDAGE CONFORM 2  STR LF (GAUZE/BANDAGES/DRESSINGS) ×2 IMPLANT
BANDAGE ELASTIC 6 VELCRO ST LF (GAUZE/BANDAGES/DRESSINGS) IMPLANT
BENZOIN TINCTURE PRP APPL 2/3 (GAUZE/BANDAGES/DRESSINGS) ×2 IMPLANT
BLADE CCA MICRO SAG (BLADE) IMPLANT
BLADE FLAT COURSE (BLADE) IMPLANT
BLADE FLAT FINE (BLADE) ×2 IMPLANT
BLADE MAXI OSCIL FINE (BLADE) IMPLANT
BLADE SAGITTAL SAW (BLADE) IMPLANT
BLADE SAGITTAL SAW 19MMX41MM (BLADE) IMPLANT
BLADE SAW 9.5MMX25.5MM (BLADE) IMPLANT
BLADE SURG 15 STRL LF DISP TIS (BLADE) ×2 IMPLANT
BLADE SURG 15 STRL SS (BLADE) ×2
BNDG ESMARK 4X9 LF (GAUZE/BANDAGES/DRESSINGS) ×2 IMPLANT
BUR EGG 3PK/BX (BURR) ×2 IMPLANT
CLOTH BEACON ORANGE TIMEOUT ST (SAFETY) ×2 IMPLANT
COVER MAYO STAND STRL (DRAPES) ×2 IMPLANT
COVER TABLE BACK 60X90 (DRAPES) ×2 IMPLANT
DEPRESSOR TONGUE BLADE STERILE (MISCELLANEOUS) IMPLANT
DRAPE C-ARM MINI 42X72 WSTRAPS (DRAPES) ×2 IMPLANT
DRAPE EXTREMITY T 121X128X90 (DRAPE) ×2 IMPLANT
DURAPREP 26ML APPLICATOR (WOUND CARE) ×2 IMPLANT
ELECT REM PT RETURN 9FT ADLT (ELECTROSURGICAL) ×2
ELECTRODE REM PT RTRN 9FT ADLT (ELECTROSURGICAL) ×1 IMPLANT
GAUZE SPONGE 4X4 16PLY XRAY LF (GAUZE/BANDAGES/DRESSINGS) ×2 IMPLANT
GAUZE XEROFORM 1X8 LF (GAUZE/BANDAGES/DRESSINGS) ×8 IMPLANT
GLOVE BIO SURGEON STRL SZ 6 (GLOVE) ×2 IMPLANT
GLOVE BIO SURGEON STRL SZ 6.5 (GLOVE) ×4 IMPLANT
GLOVE BIO SURGEON STRL SZ7 (GLOVE) ×2 IMPLANT
GLOVE BIO SURGEON STRL SZ7.5 (GLOVE) ×2 IMPLANT
GLOVE INDICATOR 7.0 STRL GRN (GLOVE) ×2 IMPLANT
GLOVE INDICATOR 7.5 STRL GRN (GLOVE) ×2 IMPLANT
GLOVE SURG SIGNA 7.5 PF LTX (GLOVE) IMPLANT
GOWN PREVENTION PLUS LG XLONG (DISPOSABLE) ×2 IMPLANT
Great toe M-P joint Medium-small ×2 IMPLANT
KWIRE 4.0 X .045IN (WIRE) IMPLANT
LOOP VESSEL MAXI BLUE (MISCELLANEOUS) ×2 IMPLANT
NEEDLE 27GAX1X1/2 (NEEDLE) IMPLANT
NEEDLE HYPO 22GX1.5 SAFETY (NEEDLE) ×2 IMPLANT
NEEDLE HYPO 25X1 1.5 SAFETY (NEEDLE) ×2 IMPLANT
NS IRRIG 500ML POUR BTL (IV SOLUTION) ×2 IMPLANT
PACK BASIN DAY SURGERY FS (CUSTOM PROCEDURE TRAY) ×2 IMPLANT
PAD CAST 4YDX4 CTTN HI CHSV (CAST SUPPLIES) ×2 IMPLANT
PADDING CAST ABS 4INX4YD NS (CAST SUPPLIES) ×1
PADDING CAST ABS COTTON 4X4 ST (CAST SUPPLIES) ×1 IMPLANT
PADDING CAST COTTON 4X4 STRL (CAST SUPPLIES) ×2
PASSER SUT SWANSON 36MM LOOP (INSTRUMENTS) IMPLANT
PENCIL BUTTON HOLSTER BLD 10FT (ELECTRODE) ×2 IMPLANT
SCOTCHCAST PLUS 5X4 WHITE (CAST SUPPLIES) IMPLANT
SPONGE GAUZE 4X4 12PLY (GAUZE/BANDAGES/DRESSINGS) IMPLANT
SPONGE LAP 4X18 X RAY DECT (DISPOSABLE) ×2 IMPLANT
STOCKINETTE 4X48 STRL (DRAPES) ×2 IMPLANT
STOCKINETTE 6  STRL (DRAPES)
STOCKINETTE 6 STRL (DRAPES) IMPLANT
STRIP CLOSURE SKIN 1/4X4 (GAUZE/BANDAGES/DRESSINGS) IMPLANT
SUCTION FRAZIER TIP 10 FR DISP (SUCTIONS) ×2 IMPLANT
SUT ETHILON 4 0 PS 2 18 (SUTURE) IMPLANT
SUT ETHILON 5 0 PS 2 18 (SUTURE) IMPLANT
SUT MNCRL AB 4-0 PS2 18 (SUTURE) ×2 IMPLANT
SUT VIC AB 3-0 SH 27 (SUTURE) ×1
SUT VIC AB 3-0 SH 27X BRD (SUTURE) ×1 IMPLANT
SUT VIC AB 5-0 PS2 18 (SUTURE) IMPLANT
SUT VICRYL 4-0 PS2 18IN ABS (SUTURE) ×2 IMPLANT
SYR BULB 3OZ (MISCELLANEOUS) ×2 IMPLANT
SYR CONTROL 10ML LL (SYRINGE) ×4 IMPLANT
TUBE CONNECTING 12X1/4 (SUCTIONS) ×2 IMPLANT
UNDERPAD 30X30 INCONTINENT (UNDERPADS AND DIAPERS) ×2 IMPLANT
WATER STERILE IRR 500ML POUR (IV SOLUTION) ×2 IMPLANT

## 2013-11-06 NOTE — Anesthesia Procedure Notes (Signed)
Procedure Name: LMA Insertion Date/Time: 11/06/2013 8:05 AM Performed by: Norva Pavlov Pre-anesthesia Checklist: Patient identified, Emergency Drugs available, Suction available and Patient being monitored Patient Re-evaluated:Patient Re-evaluated prior to inductionOxygen Delivery Method: Circle System Utilized Preoxygenation: Pre-oxygenation with 100% oxygen Intubation Type: IV induction Ventilation: Mask ventilation without difficulty LMA: LMA inserted LMA Size: 4.0 Number of attempts: 1 Airway Equipment and Method: bite block Placement Confirmation: positive ETCO2 Tube secured with: Tape Dental Injury: Teeth and Oropharynx as per pre-operative assessment

## 2013-11-06 NOTE — Transfer of Care (Signed)
Immediate Anesthesia Transfer of Care Note  Patient: Lori Curtis  Procedure(s) Performed: Procedure(s) (LRB): IMPLANT OF A SILASTIC METATARSAL PHALANGE JOINT (Left)  Patient Location: PACU  Anesthesia Type: General  Level of Consciousness: awake, alert  and oriented  Airway & Oxygen Therapy: Patient Spontanous Breathing and Patient connected to face mask oxygen  Post-op Assessment: Report given to PACU RN and Post -op Vital signs reviewed and stable  Post vital signs: Reviewed and stable  Complications: No apparent anesthesia complications

## 2013-11-06 NOTE — Anesthesia Postprocedure Evaluation (Signed)
  Anesthesia Post-op Note  Patient: Lori Curtis  Procedure(s) Performed: Procedure(s) (LRB): IMPLANT OF A SILASTIC METATARSAL PHALANGE JOINT (Left)  Patient Location: PACU  Anesthesia Type: General  Level of Consciousness: awake and alert   Airway and Oxygen Therapy: Patient Spontanous Breathing  Post-op Pain: mild  Post-op Assessment: Post-op Vital signs reviewed, Patient's Cardiovascular Status Stable, Respiratory Function Stable, Patent Airway and No signs of Nausea or vomiting  Last Vitals:  Filed Vitals:   11/06/13 1000  BP: 131/73  Pulse: 66  Temp:   Resp: 17    Post-op Vital Signs: stable   Complications: No apparent anesthesia complications

## 2013-11-06 NOTE — Anesthesia Preprocedure Evaluation (Signed)
Anesthesia Evaluation  Patient identified by MRN, date of birth, ID band Patient awake    Reviewed: Allergy & Precautions, H&P , NPO status , Patient's Chart, lab work & pertinent test results  Airway Mallampati: II TM Distance: >3 FB Neck ROM: Full    Dental no notable dental hx.    Pulmonary neg pulmonary ROS, Current Smoker,  breath sounds clear to auscultation  Pulmonary exam normal       Cardiovascular hypertension, Pt. on medications and Pt. on home beta blockers Rhythm:Regular Rate:Normal     Neuro/Psych negative neurological ROS  negative psych ROS   GI/Hepatic negative GI ROS, Neg liver ROS,   Endo/Other  Hyperthyroidism   Renal/GU negative Renal ROS  negative genitourinary   Musculoskeletal negative musculoskeletal ROS (+)   Abdominal   Peds negative pediatric ROS (+)  Hematology negative hematology ROS (+)   Anesthesia Other Findings   Reproductive/Obstetrics negative OB ROS                           Anesthesia Physical Anesthesia Plan  ASA: II  Anesthesia Plan: General   Post-op Pain Management:    Induction: Intravenous  Airway Management Planned: LMA  Additional Equipment:   Intra-op Plan:   Post-operative Plan:   Informed Consent: I have reviewed the patients History and Physical, chart, labs and discussed the procedure including the risks, benefits and alternatives for the proposed anesthesia with the patient or authorized representative who has indicated his/her understanding and acceptance.   Dental advisory given  Plan Discussed with: CRNA and Surgeon  Anesthesia Plan Comments:         Anesthesia Quick Evaluation

## 2013-11-07 NOTE — Op Note (Signed)
NAME:  Lori Curtis, Lori Curtis            ACCOUNT NO.:  [id number]  MEDICAL RECORD NO.:  [id number]  LOCATION:                                 FACILITY:  PHYSICIAN:  Ezequiel Kayser. Navarre Diana, D.P.M.DATE OF BIRTH:  04-15-1951  DATE OF PROCEDURE:  11/06/2013 DATE OF DISCHARGE:                              OPERATIVE REPORT   SURGEON:  Ezequiel Kayser. Matheson Vandehei, DPM.  ASSISTANT:  None.  PREOPERATIVE DIAGNOSIS:  Hallux rigidus, left foot.  POSTOPERATIVE DIAGNOSIS: 1. Hallux rigidus, left foot. 2. First metatarsophalangeal joint, left foot.  ANESTHESIA:  LMA.  COMPLICATIONS:  None.  DESCRIPTION OF PROCEDURE:  The patient was brought to the OR and placed in the supine position at which time LMA anesthesia was administered.  A local block was performed with 1:1 mixture of 0.5% Marcaine plain and 2% lidocaine plain.  A well padded pneumatic ankle tourniquet was inflated to 250 mmHg.  The patient was prepped and draped in the usual aseptic manner and the previously applied tourniquet was inflated to 250 mmHg. Attention was directed to the first ray where a dorsal linear incision was made.  The incision was deepened via sharp and blunt modalities, taking care to clamp and cauterizing vessels and gentle traction of all neurovascular structures encountered.  The deep and superficial fascia were separated medially and laterally at the length of the incision. The incision was continued such that a linear capsulotomy was performed. The capsule and periosteum were freed of the head at the metatarsal and base of the proximal phalanx.  There was moderate amount of hypertrophic bone lipping, both at the head of the first metatarsal and at the base of the proximal phalanx, which was all resected.  The medial eminence was resected parallel with the shaft.  The first MTP joint was evaluated and approximately 50% was eroded with one large erosion at the medial half of the first MTP joint at the head of the first  metatarsal. Attention was then directed to the proximal aspect of the proximal phalanx where the bone resected enough to allow for the implant to be placed in the proximal phalanx and to decompress the joint.  This was less than one-third of the total shaft of the proximal phalanx.  This cut was made perpendicular to the weightbearing surfaces of the foot. The area was irrigated with copious amounts of sterile saline and antibiotic solution.  The central area of the proximal phalanx was drilled and a sizer was placed to allow for appropriate implant sizing. First MTP joint was evaluated for range of motion and all.  Hypertrophic bone was resected and remodelled and smooth.  It was determined that a small medium size cobalt chromium nonporous implant was appropriate. Using broach into the proximal phalanx this allowed for placement of the implant.  Surgical wound had been irrigated with copious amounts of sterile saline and antibiotic solution before the implant was placed. The implant was then fitted into the proximal phalanx.  MTP joint range of motion was excellent with not crepitus.  Surgical wound irrigated again and range of motion was evaluated and found to be excellent.  A placement of the implant was confirmed with intraoperative radiographs.  Deep closure of  the capsule and periosteum was reapproximated with 3-0 Vicryl and deep closure accomplished with 4-0 Vicryl.  Skin closure with 4-0 Monocryl in a running subcuticular fashion.  Postoperatively 10 mL of additional 0.5% Marcaine plain was infiltrated around the surgical site.  The foot was dressed with Steri-Strips, 4 x 4, Kling, and Coban. The tourniquet was deflated and vascular status returned to all digits. The patient was sent to the recovery room with vital signs stable and capillary refill time to presurgical level.  Both written and oral postoperative instructions were given to the patient.  No  guarantees given.          ______________________________ Ezequiel Kayser. Harriet Pho, D.P.M.     MJA/MEDQ  D:  11/06/2013  T:  11/07/2013  Job:  [id number]

## 2013-11-11 ENCOUNTER — Encounter (HOSPITAL_BASED_OUTPATIENT_CLINIC_OR_DEPARTMENT_OTHER): Payer: Self-pay | Admitting: Podiatry

## 2014-06-03 ENCOUNTER — Other Ambulatory Visit: Payer: Self-pay | Admitting: Podiatry

## 2014-06-03 DIAGNOSIS — R609 Edema, unspecified: Secondary | ICD-10-CM

## 2014-06-03 DIAGNOSIS — R52 Pain, unspecified: Secondary | ICD-10-CM

## 2014-06-04 ENCOUNTER — Other Ambulatory Visit: Payer: BC Managed Care – PPO

## 2014-06-26 IMAGING — CR DG CHEST 2V
2 series · 2 of 2 positions shown · non-contrast
Comparison: 07/11/2012

CLINICAL DATA: Preop radiograph

EXAM:
CHEST  2 VIEW

[w chest pa]
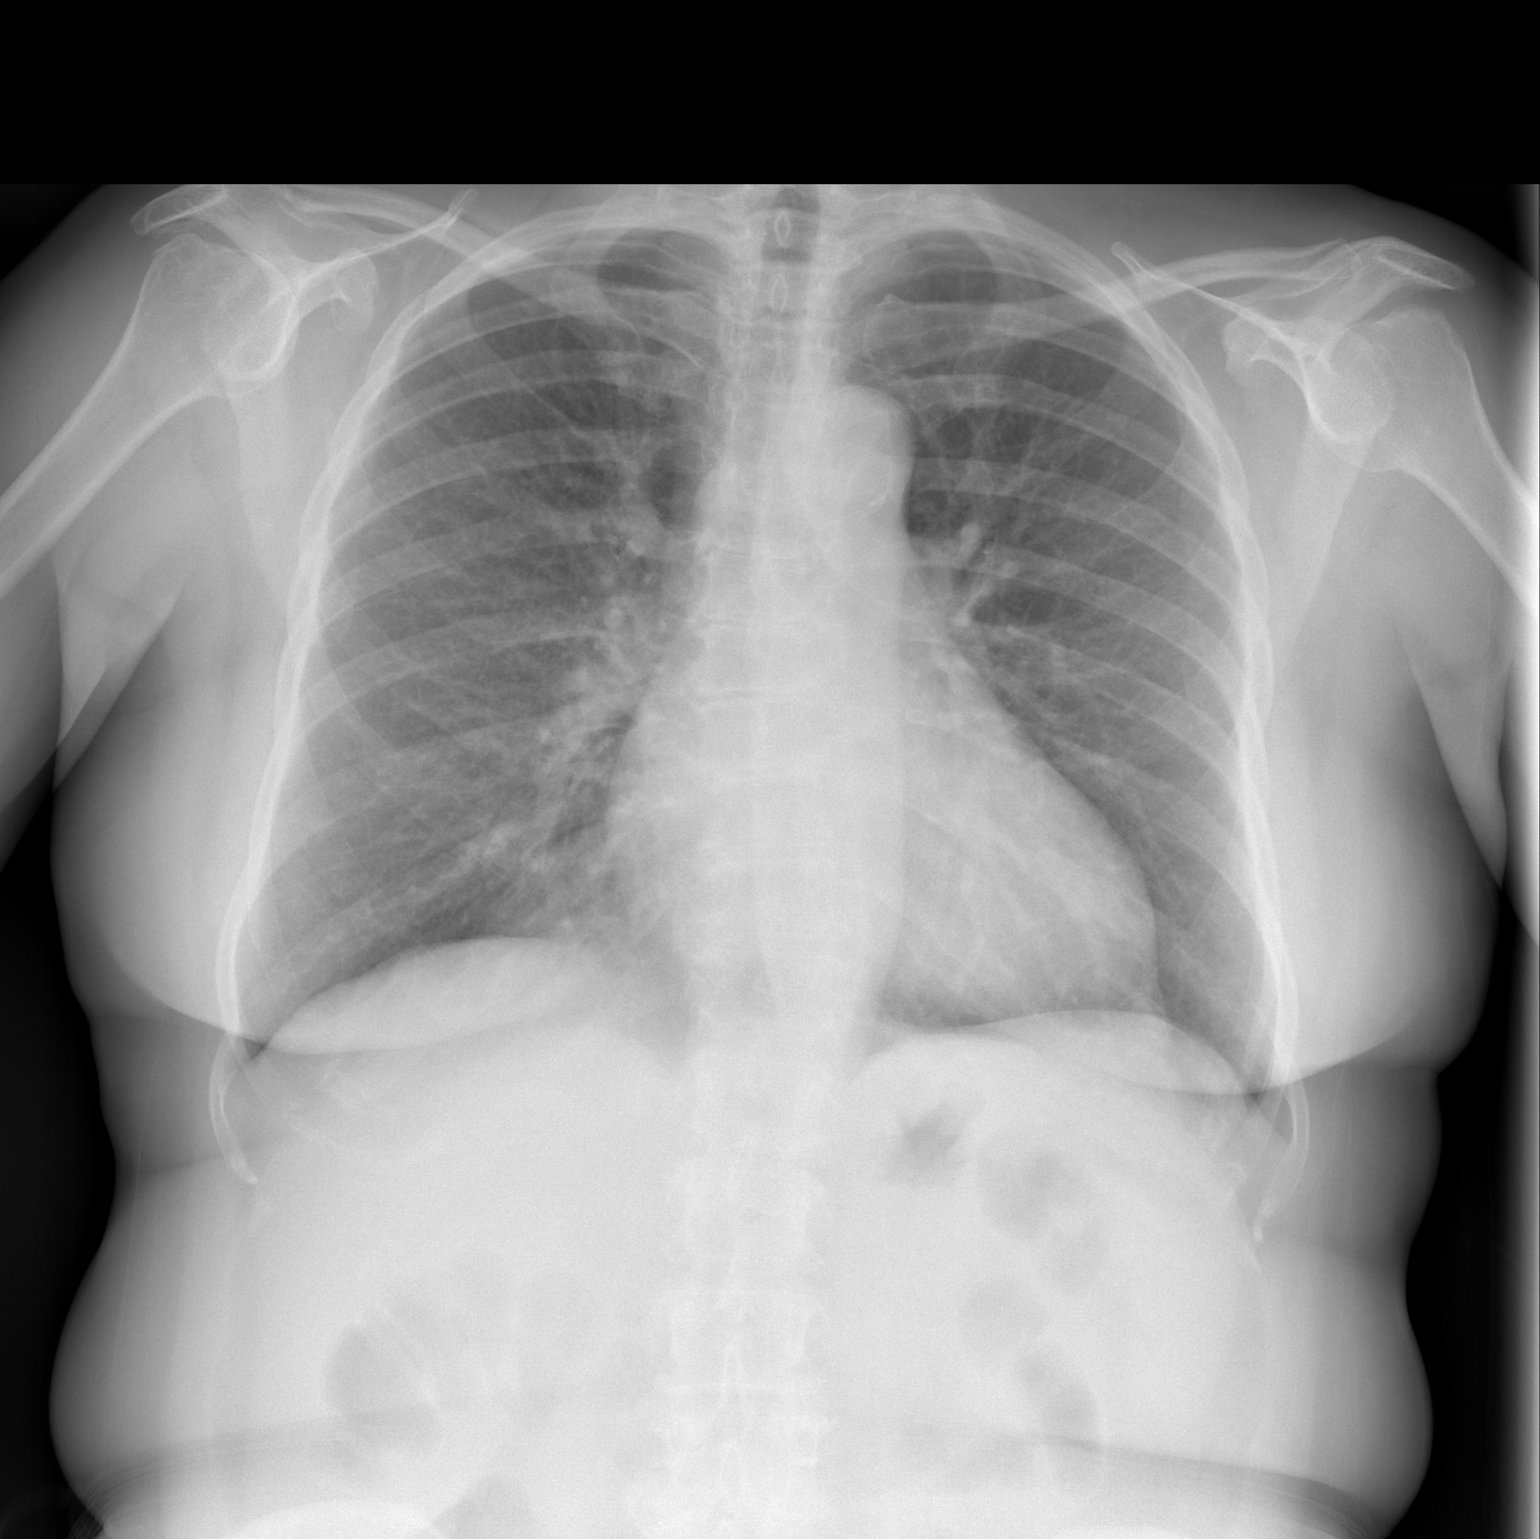

[w chest lat]
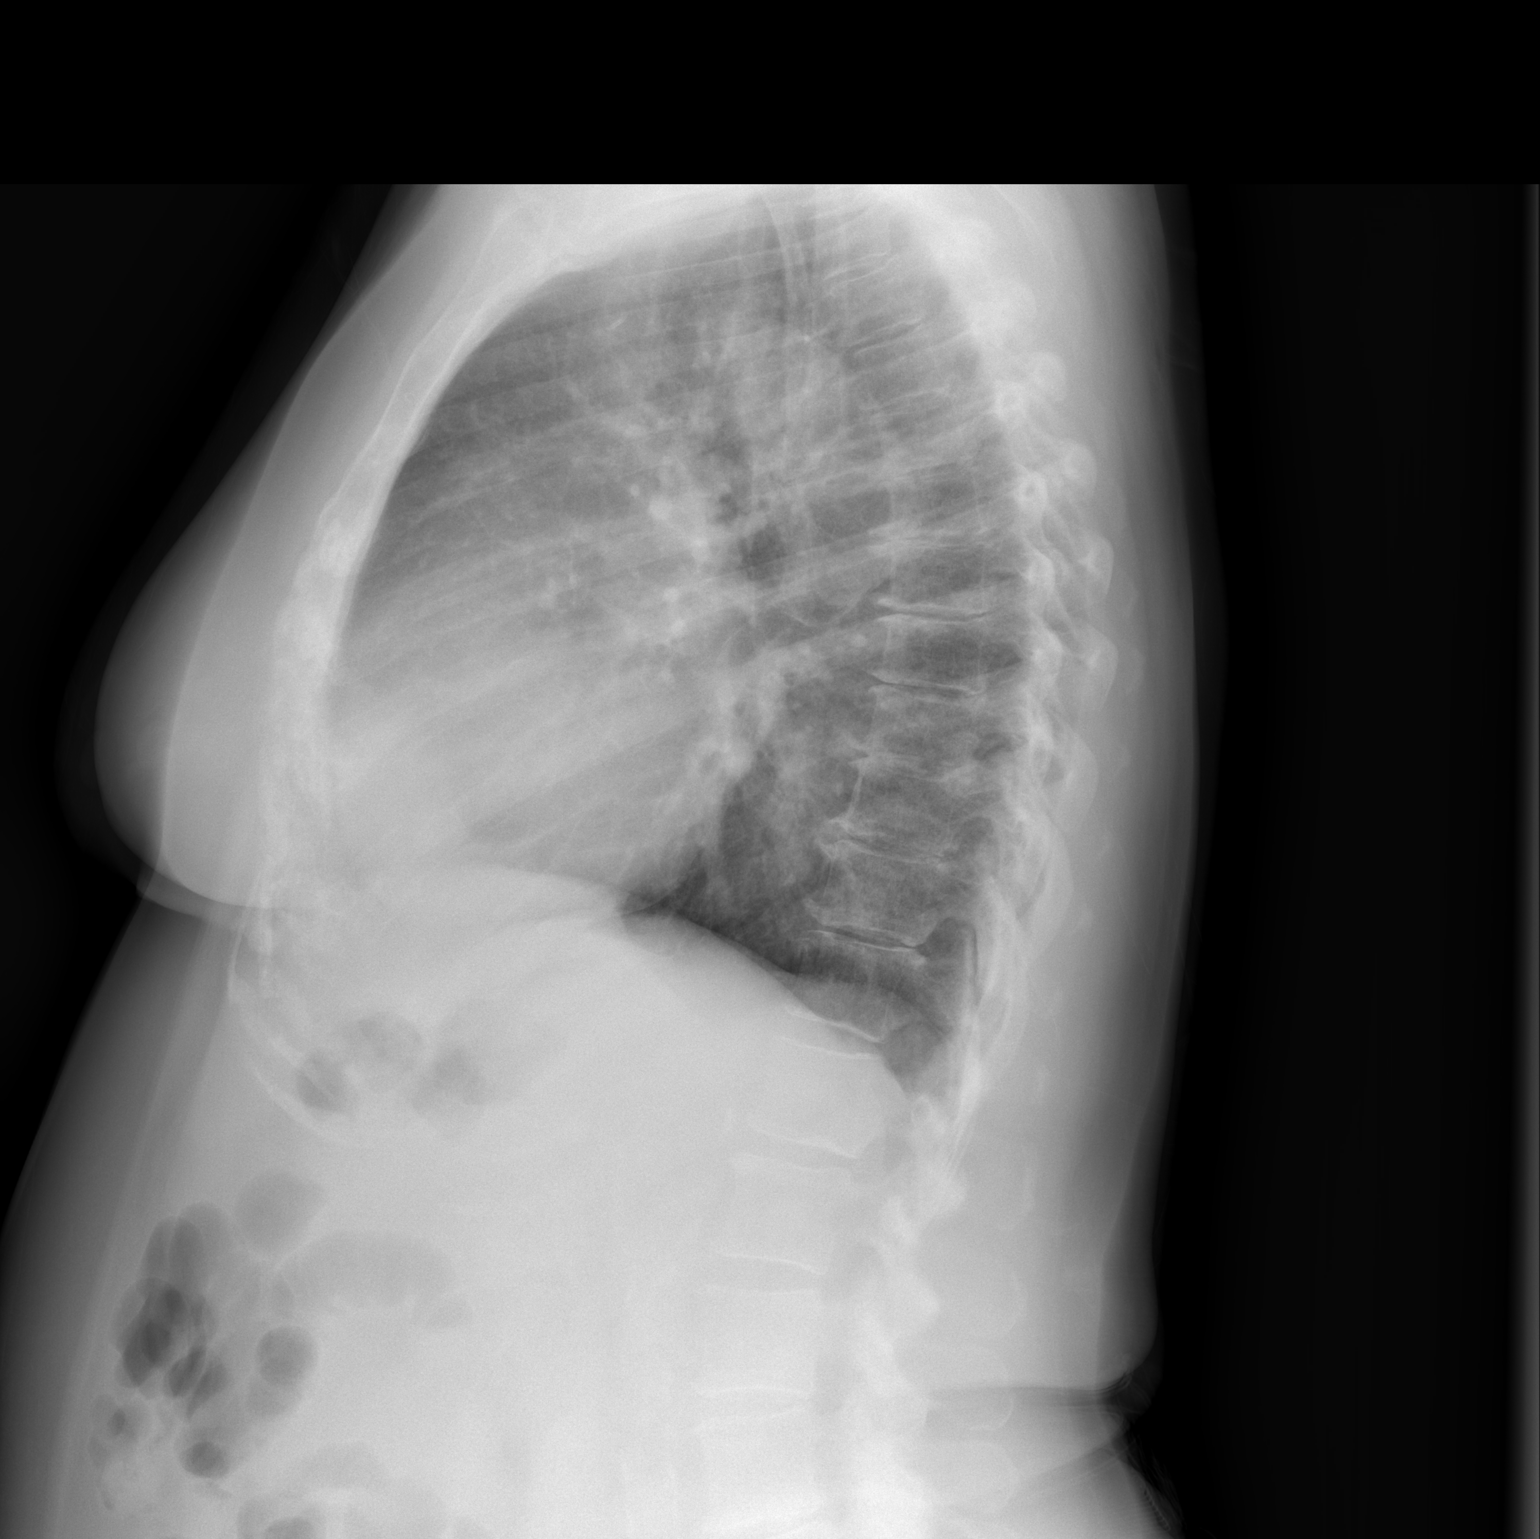

[2 of 2 positions shown; findings below may reference images not displayed]

FINDINGS: The heart size and mediastinal contours are within normal limits.
Both lungs are clear. Diffuse interstitial prominence has improved
from previous exam. The visualized skeletal structures are
unremarkable.
IMPRESSION: 1. No acute cardiopulmonary abnormalities.
2. Improved appearance of diffuse interstitial prominence.

## 2016-05-02 ENCOUNTER — Telehealth: Payer: Self-pay | Admitting: *Deleted

## 2016-05-02 NOTE — Telephone Encounter (Signed)
21 pages received via faxed. Forwarded to Dr. Ardine Bjorkopland/Tanesha. JG//CMA

## 2016-05-02 NOTE — Telephone Encounter (Signed)
41 pages received via fax from [facility] [employer]. Forwarded to Dr. Ardine Bjorkopland/Tanesha. JG//CMA

## 2016-05-09 ENCOUNTER — Encounter: Payer: Self-pay | Admitting: Family Medicine

## 2016-05-09 DIAGNOSIS — K219 Gastro-esophageal reflux disease without esophagitis: Secondary | ICD-10-CM | POA: Insufficient documentation

## 2016-05-09 DIAGNOSIS — E785 Hyperlipidemia, unspecified: Secondary | ICD-10-CM | POA: Insufficient documentation

## 2016-05-09 DIAGNOSIS — I10 Essential (primary) hypertension: Secondary | ICD-10-CM | POA: Insufficient documentation

## 2016-05-09 DIAGNOSIS — E042 Nontoxic multinodular goiter: Secondary | ICD-10-CM | POA: Insufficient documentation

## 2016-05-09 DIAGNOSIS — E0500 Thyrotoxicosis with diffuse goiter without thyrotoxic crisis or storm: Secondary | ICD-10-CM | POA: Insufficient documentation

## 2016-06-22 ENCOUNTER — Ambulatory Visit: Payer: Self-pay | Admitting: Family Medicine
